# Patient Record
Sex: Female | Born: 1995 | Race: White | Hispanic: No | Marital: Single | Smoking: Current every day smoker
Health system: Southern US, Community
[De-identification: ages and names within clinical notes are randomized; demographics above are authoritative.]

## PROBLEM LIST (undated history)

## (undated) DIAGNOSIS — Z789 Other specified health status: Secondary | ICD-10-CM

## (undated) HISTORY — PX: NO PAST SURGERIES: SHX2092

---

## 2015-03-07 ENCOUNTER — Emergency Department
Admit: 2015-03-07 | Disposition: A | Discharge status: Home / Self Care | Payer: Self-pay | Admitting: Emergency Medicine

## 2015-03-07 LAB — URINALYSIS, COMPLETE
Bacteria: NONE SEEN
Bilirubin,UR: NEGATIVE
Glucose,UR: NEGATIVE mg/dL (ref 0–75)
Leukocyte Esterase: NEGATIVE
NITRITE: NEGATIVE
PH: 6 (ref 4.5–8.0)
PROTEIN: NEGATIVE
SPECIFIC GRAVITY: 1.012 (ref 1.003–1.030)

## 2015-03-07 LAB — BASIC METABOLIC PANEL
ANION GAP: 9 (ref 7–16)
BUN: 8 mg/dL
CHLORIDE: 107 mmol/L
CO2: 23 mmol/L
Calcium, Total: 9.4 mg/dL
Creatinine: 0.61 mg/dL
EGFR (African American): >60
GLUCOSE: 89 mg/dL
Potassium: 3.8 mmol/L
Sodium: 139 mmol/L

## 2015-03-07 LAB — CBC
HCT: 38.8 % (ref 35.0–47.0)
HGB: 13 g/dL (ref 12.0–16.0)
MCH: 28.7 pg (ref 26.0–34.0)
MCHC: 33.5 g/dL (ref 32.0–36.0)
MCV: 86 fL (ref 80–100)
Platelet: 319 10*3/uL (ref 150–440)
RBC: 4.52 10*6/uL (ref 3.80–5.20)
RDW: 13 % (ref 11.5–14.5)
WBC: 12.8 10*3/uL — ABNORMAL HIGH (ref 3.6–11.0)

## 2015-03-07 LAB — TROPONIN I

## 2016-04-21 ENCOUNTER — Encounter: Payer: Self-pay | Admitting: Obstetrics and Gynecology

## 2016-05-11 ENCOUNTER — Encounter: Payer: Self-pay | Admitting: Obstetrics and Gynecology

## 2016-05-16 ENCOUNTER — Encounter: Payer: Self-pay | Admitting: Obstetrics and Gynecology

## 2016-05-16 ENCOUNTER — Ambulatory Visit (INDEPENDENT_AMBULATORY_CARE_PROVIDER_SITE_OTHER): Payer: BLUE CROSS/BLUE SHIELD

## 2016-05-16 ENCOUNTER — Encounter (HOSPITAL_COMMUNITY): Payer: Self-pay

## 2016-05-16 ENCOUNTER — Ambulatory Visit (INDEPENDENT_AMBULATORY_CARE_PROVIDER_SITE_OTHER): Payer: BLUE CROSS/BLUE SHIELD | Admitting: Obstetrics and Gynecology

## 2016-05-16 ENCOUNTER — Other Ambulatory Visit: Payer: Self-pay | Admitting: Obstetrics and Gynecology

## 2016-05-16 ENCOUNTER — Encounter (HOSPITAL_COMMUNITY)
Admission: RE | Admit: 2016-05-16 | Discharge: 2016-05-16 | Disposition: A | Discharge status: Home / Self Care | Payer: BLUE CROSS/BLUE SHIELD | Source: Ambulatory Visit | Attending: Obstetrics and Gynecology | Admitting: Obstetrics and Gynecology

## 2016-05-16 VITALS — BP 120/70 | Ht 62.0 in | Wt 128.0 lb

## 2016-05-16 DIAGNOSIS — IMO0002 Reserved for concepts with insufficient information to code with codable children: Secondary | ICD-10-CM

## 2016-05-16 DIAGNOSIS — O021 Missed abortion: Secondary | ICD-10-CM | POA: Diagnosis not present

## 2016-05-16 DIAGNOSIS — O283 Abnormal ultrasonic finding on antenatal screening of mother: Secondary | ICD-10-CM | POA: Diagnosis not present

## 2016-05-16 DIAGNOSIS — O3680X1 Pregnancy with inconclusive fetal viability, fetus 1: Secondary | ICD-10-CM

## 2016-05-16 DIAGNOSIS — Z3A08 8 weeks gestation of pregnancy: Secondary | ICD-10-CM

## 2016-05-16 DIAGNOSIS — Z3201 Encounter for pregnancy test, result positive: Secondary | ICD-10-CM

## 2016-05-16 DIAGNOSIS — Z87891 Personal history of nicotine dependence: Secondary | ICD-10-CM | POA: Diagnosis not present

## 2016-05-16 DIAGNOSIS — Z880 Allergy status to penicillin: Secondary | ICD-10-CM | POA: Diagnosis not present

## 2016-05-16 HISTORY — DX: Other specified health status: Z78.9

## 2016-05-16 LAB — CBC
HEMATOCRIT: 39.1 % (ref 36.0–46.0)
HEMOGLOBIN: 12.9 g/dL (ref 12.0–15.0)
MCH: 28.7 pg (ref 26.0–34.0)
MCHC: 33 g/dL (ref 30.0–36.0)
MCV: 87.1 fL (ref 78.0–100.0)
Platelets: 298 10*3/uL (ref 150–400)
RBC: 4.49 MIL/uL (ref 3.87–5.11)
RDW: 12.7 % (ref 11.5–15.5)
WBC: 9.7 10*3/uL (ref 4.0–10.5)

## 2016-05-16 LAB — URINALYSIS, ROUTINE W REFLEX MICROSCOPIC
Bilirubin Urine: NEGATIVE
Glucose, UA: NEGATIVE mg/dL
Hgb urine dipstick: NEGATIVE
Ketones, ur: NEGATIVE mg/dL
Nitrite: NEGATIVE
PH: 5.5 (ref 5.0–8.0)
Protein, ur: NEGATIVE mg/dL
Specific Gravity, Urine: 1.02 (ref 1.005–1.030)

## 2016-05-16 LAB — URINE MICROSCOPIC-ADD ON: RBC / HPF: NONE SEEN RBC/hpf (ref 0–5)

## 2016-05-16 LAB — POCT URINE PREGNANCY: PREG TEST UR: POSITIVE — AB

## 2016-05-16 NOTE — Progress Notes (Signed)
Patient ID: Taylor Miller, female   DOB: 02/03/1996, 20 y.o.   MRN: [id number]  Preoperative History and Physical  Taylor PeachBrooke M Hollomon is a 20 y.o. No obstetric history on file. Pt here to discuss a D&C. She had an US at [facility] on 04/04/16 and was told there was no fetal heartbeat. Per US review, baby was 1.8 cm, 8 weeks 2 days. Pt was 14 weeks per menstrual calulations. [place] was sent [place] the [place] [facility]. Denies any spotting or bleeding.  Proposed surgery: D&C, suction  History reviewed. No pertinent past medical history. History reviewed. No pertinent past surgical history. OB History  No data available  Patient denies any other pertinent gynecologic issues.   No current outpatient prescriptions on file prior to visit.   No current facility-administered medications on file prior to visit.   Allergies  Allergen Reactions  . Penicillins     As a child   . Asa [Aspirin] Rash    Social History:   reports that she has quit smoking. She has never used smokeless tobacco. She reports that she does not drink alcohol or use illicit drugs.  History reviewed. No pertinent family history.  Review of Systems: Noncontributory  PHYSICAL EXAM: Blood pressure 120/70, height 5\' 2"  (1.575 m), weight 128 lb (58.06 kg). General appearance - alert, well appearing, and in no distress Extremities - peripheral pulses normal, no pedal edema, no clubbing or cyanosis See pelvic u/s Labs: Results for orders placed or performed in visit on 05/16/16 (from the past 336 hour(s))  POCT urine pregnancy   Collection Time: 05/16/16 11:55 AM  Result Value Ref Range   Preg Test, Ur Positive (A) Negative    Imaging Studies: No results found.  Assessment: There are no active problems to display for this patient. US confirms non viable pregnancy with 8 week fetal pole and 11 week sac; pt not considered a candidate for Cytotec or medical management, will proceed with D&C Discussed  procedure with pt, risks, rationale for D&C instead of cytotec, potential for complications,  Discussed birth control options s/p procedure  Plan: Suction D&C scheduled 05/18/16 Pre-op visit at 1:15pm today  By signing my name below, I, Marisue HumbleMichelle Chaffee, attest that this documentation has been prepared under the direction and in the presence of Tilda BurrowJohn V Carrolyn Hilmes, MD . Electronically Signed: Marisue HumbleMichelle Chaffee, Scribe. 05/16/2016. 12:45 PM.  I personally performed the services described in this documentation, which was SCRIBED in my presence. The recorded information has been reviewed and considered accurate. It has been edited as necessary during review. Tilda BurrowFERGUSON,Makell Drohan V, MD

## 2016-05-16 NOTE — Progress Notes (Signed)
Patient ID: Taylor Miller, female   DOB: 10/31/1996, 20 y.o.   MRN: [id number] Pt here today to discuss a D&C. Pt states that she had an US on 04/04/16 and was told there were no FHT. Pt states that she never bled or took any medication at all. UPT today is faintly positive.

## 2016-05-16 NOTE — Progress Notes (Signed)
US 7+6 wks single IUP w/NO fht,crl 1.5 cm,5.5 cm GS=11+3 wks,normal ov's bilat,results were discussed w/Dr.Ferguson.

## 2016-05-16 NOTE — Patient Instructions (Signed)
Taylor Miller  05/16/2016     @PREFPERIOPPHARMACY @   Your procedure is scheduled on 05/18/2016  [facility] at [facility]20 [facility].  Call this number if you have problems the morning of surgery:  [phone]   Remember:  Do not eat food or drink liquids after midnight.  Take these medicines the morning of surgery with A SIP OF WATER None   Do not wear jewelry, make-up or nail polish.  Do not wear lotions, powders, or perfumes.  You may wear deoderant.  Do not shave 48 hours prior to surgery.  Men may shave face and neck.  Do not bring valuables to the hospital.  [facility] is not responsible for any belongings or valuables.  Contacts, dentures or bridgework may not be worn into surgery.  Leave your suitcase in the car.  After surgery it may be brought to your room.  For patients admitted to the hospital, discharge time will be determined by your treatment team.  Patients discharged the day of surgery will not be allowed to drive home.    Please read over the following fact sheets that you were given. Surgical Site Infection Prevention and Anesthesia Post-op Instructions     PATIENT INSTRUCTIONS POST-ANESTHESIA  IMMEDIATELY FOLLOWING SURGERY:  Do not drive or operate machinery for the first twenty four hours after surgery.  Do not make any important decisions for twenty four hours after surgery or while taking narcotic pain medications or sedatives.  If you develop intractable nausea and vomiting or a severe headache please notify your doctor immediately.  FOLLOW-UP:  Please make an appointment with your surgeon as instructed. You do not need to follow up with anesthesia unless specifically instructed to do so.  WOUND CARE INSTRUCTIONS (if applicable):  Keep a dry clean dressing on the anesthesia/puncture wound site if there is drainage.  Once the wound has quit draining you may leave it open to air.  Generally you should leave the bandage intact for twenty four hours  unless there is drainage.  If the epidural site drains for more than 36-48 hours please call the anesthesia department.  QUESTIONS?:  Please feel free to call your physician or the hospital operator if you have any questions, and they will be happy to assist you.      Dilation and Curettage or Vacuum Curettage Dilation and curettage (D&C) and vacuum curettage are minor procedures. A D&C involves stretching (dilation) the cervix and scraping (curettage) the inside lining of the womb (uterus). During a D&C, tissue is gently scraped from the inside lining of the uterus. During a vacuum curettage, the lining and tissue in the uterus are removed with the use of gentle suction.  Curettage may be performed to either diagnose or treat a problem. As a diagnostic procedure, curettage is performed to examine tissues from the uterus. A diagnostic curettage may be performed for the following symptoms:   Irregular bleeding in the uterus.   Bleeding with the development of clots.   Spotting between menstrual periods.   Prolonged menstrual periods.   Bleeding after menopause.   No menstrual period (amenorrhea).   A change in size and shape of the uterus.  As a treatment procedure, curettage may be performed for the following reasons:   Removal of an IUD (intrauterine device).   Removal of retained placenta after giving birth. Retained placenta can cause an infection or bleeding severe enough to require transfusions.   Abortion.   Miscarriage.   Removal of  polyps inside the uterus.   Removal of uncommon types of noncancerous lumps (fibroids).  LET [facility] KNOW ABOUT:   Any allergies you have.   All medicines you are taking, including vitamins, herbs, eye drops, creams, and over-the-counter medicines.   Previous problems you or members of your family have had with the use of anesthetics.   Any blood disorders you have.   Previous surgeries you have had.    Medical conditions you have. RISKS AND COMPLICATIONS  Generally, this is a safe procedure. However, as with any procedure, complications can occur. Possible complications include:  Excessive bleeding.   Infection of the uterus.   Damage to the cervix.   Development of scar tissue (adhesions) inside the uterus, later causing abnormal amounts of menstrual bleeding.   Complications from the general anesthetic, if a general anesthetic is used.   Putting a hole (perforation) in the uterus. This is rare.  BEFORE THE PROCEDURE   Eat and drink before the procedure only as directed by your health care provider.   Arrange for someone to take you home.  PROCEDURE  This procedure usually takes about 15-30 minutes.  You will be given one of the following:  A medicine that numbs the area in and around the cervix (local anesthetic).   A medicine to make you sleep through the procedure (general anesthetic).  You will lie on your back with your legs in stirrups.   A warm metal or plastic instrument (speculum) will be placed in your vagina to keep it open and to allow the health care provider to see the cervix.  There are two ways in which your cervix can be softened and dilated. These include:   Taking a medicine.   Having thin rods (laminaria) inserted into your cervix.   A curved tool (curette) will be used to scrape cells from the inside lining of the uterus. In some cases, gentle suction is applied with the curette. The curette will then be removed.  AFTER THE PROCEDURE   You will rest in the recovery area until you are stable and are ready to go home.   You may feel sick to your stomach (nauseous) or throw up (vomit) if you were given a general anesthetic.   You may have a sore throat if a tube was placed in your throat during general anesthesia.   You may have light cramping and bleeding. This may last for 2 days to 2 weeks after the procedure.   Your  uterus needs to make a new lining after the procedure. This may make your next period late.   This information is not intended to replace advice given to you by your health care provider. Make sure you discuss any questions you have with your health care provider.   Document Released: 10/31/2005 Document Revised: 07/03/2013 Document Reviewed: 05/30/2013 Elsevier Interactive Patient Education [employer]2016 Elsevier Inc.

## 2016-05-18 ENCOUNTER — Ambulatory Visit (HOSPITAL_COMMUNITY)
Admission: RE | Admit: 2016-05-18 | Discharge: 2016-05-18 | Disposition: A | Discharge status: Home / Self Care | Payer: BLUE CROSS/BLUE SHIELD | Source: Ambulatory Visit | Attending: Obstetrics and Gynecology | Admitting: Obstetrics and Gynecology

## 2016-05-18 ENCOUNTER — Encounter (HOSPITAL_COMMUNITY)
Admission: RE | Disposition: A | Discharge status: Home / Self Care | Payer: Self-pay | Source: Ambulatory Visit | Attending: Obstetrics and Gynecology

## 2016-05-18 ENCOUNTER — Ambulatory Visit (HOSPITAL_COMMUNITY): Payer: BLUE CROSS/BLUE SHIELD | Admitting: Anesthesiology

## 2016-05-18 ENCOUNTER — Encounter (HOSPITAL_COMMUNITY): Payer: Self-pay | Admitting: *Deleted

## 2016-05-18 DIAGNOSIS — Z880 Allergy status to penicillin: Secondary | ICD-10-CM | POA: Insufficient documentation

## 2016-05-18 DIAGNOSIS — Z87891 Personal history of nicotine dependence: Secondary | ICD-10-CM | POA: Insufficient documentation

## 2016-05-18 DIAGNOSIS — O021 Missed abortion: Secondary | ICD-10-CM | POA: Insufficient documentation

## 2016-05-18 HISTORY — PX: DILATION AND CURETTAGE OF UTERUS: SHX78

## 2016-05-18 LAB — BASIC METABOLIC PANEL
Anion gap: 6 (ref 5–15)
BUN: 11 mg/dL (ref 6–20)
CALCIUM: 8.9 mg/dL (ref 8.9–10.3)
CHLORIDE: 103 mmol/L (ref 101–111)
CO2: 25 mmol/L (ref 22–32)
CREATININE: 0.73 mg/dL (ref 0.44–1.00)
Glucose, Bld: 92 mg/dL (ref 65–99)
Potassium: 3.4 mmol/L — ABNORMAL LOW (ref 3.5–5.1)
SODIUM: 134 mmol/L — AB (ref 135–145)

## 2016-05-18 LAB — ABO/RH: ABO/RH(D): B POS

## 2016-05-18 SURGERY — DILATION AND CURETTAGE
Anesthesia: General | Site: Uterus

## 2016-05-18 MED ORDER — KETOROLAC TROMETHAMINE 30 MG/ML IJ SOLN: 30.0000 mg | Freq: Once | INTRAMUSCULAR | Status: DC

## 2016-05-18 MED ORDER — MIDAZOLAM HCL 5 MG/5ML IJ SOLN
INTRAMUSCULAR | Status: DC | PRN
  Administered 2016-05-18: 2 mg via INTRAVENOUS

## 2016-05-18 MED ORDER — FENTANYL CITRATE (PF) 100 MCG/2ML IJ SOLN
25.0000 ug | INTRAMUSCULAR | Status: DC | PRN
  Administered 2016-05-18: 50 ug via INTRAVENOUS
  Filled 2016-05-18: qty 2

## 2016-05-18 MED ORDER — METRONIDAZOLE IN NACL 5-0.79 MG/ML-% IV SOLN
500.0000 mg | INTRAVENOUS | Status: AC
  Administered 2016-05-18: 500 mg via INTRAVENOUS

## 2016-05-18 MED ORDER — PROPOFOL 10 MG/ML IV BOLUS
INTRAVENOUS | Status: DC | PRN
  Administered 2016-05-18: 150 mg via INTRAVENOUS

## 2016-05-18 MED ORDER — PROPOFOL 10 MG/ML IV BOLUS
INTRAVENOUS | Status: AC
Start: 2016-05-18 — End: 2016-05-18
  Filled 2016-05-18: qty 20

## 2016-05-18 MED ORDER — METRONIDAZOLE IN NACL 5-0.79 MG/ML-% IV SOLN
INTRAVENOUS | Status: AC
  Filled 2016-05-18: qty 100

## 2016-05-18 MED ORDER — ARTIFICIAL TEARS OP OINT
TOPICAL_OINTMENT | OPHTHALMIC | Status: AC
  Filled 2016-05-18: qty 3.5

## 2016-05-18 MED ORDER — ONDANSETRON HCL 4 MG/2ML IJ SOLN
4.0000 mg | Freq: Once | INTRAMUSCULAR | Status: AC
  Administered 2016-05-18: 4 mg via INTRAVENOUS

## 2016-05-18 MED ORDER — MIDAZOLAM HCL 2 MG/2ML IJ SOLN
INTRAMUSCULAR | Status: AC
  Filled 2016-05-18: qty 2

## 2016-05-18 MED ORDER — FENTANYL CITRATE (PF) 100 MCG/2ML IJ SOLN
INTRAMUSCULAR | Status: AC
  Filled 2016-05-18: qty 2

## 2016-05-18 MED ORDER — SUCCINYLCHOLINE CHLORIDE 20 MG/ML IJ SOLN
INTRAMUSCULAR | Status: AC
  Filled 2016-05-18: qty 1

## 2016-05-18 MED ORDER — LIDOCAINE HCL (PF) 1 % IJ SOLN
INTRAMUSCULAR | Status: AC
  Filled 2016-05-18: qty 5

## 2016-05-18 MED ORDER — FENTANYL CITRATE (PF) 100 MCG/2ML IJ SOLN
INTRAMUSCULAR | Status: DC | PRN
  Administered 2016-05-18: 25 ug via INTRAVENOUS
  Administered 2016-05-18: 50 ug via INTRAVENOUS
  Administered 2016-05-18: 25 ug via INTRAVENOUS

## 2016-05-18 MED ORDER — LACTATED RINGERS IV SOLN
INTRAVENOUS | Status: DC
  Administered 2016-05-18 (×3): via INTRAVENOUS

## 2016-05-18 MED ORDER — ONDANSETRON HCL 4 MG/2ML IJ SOLN
INTRAMUSCULAR | Status: AC
  Filled 2016-05-18: qty 2

## 2016-05-18 MED ORDER — LIDOCAINE HCL (CARDIAC) 20 MG/ML IV SOLN
INTRAVENOUS | Status: DC | PRN
  Administered 2016-05-18: 50 mg via INTRAVENOUS

## 2016-05-18 MED ORDER — OXYTOCIN 10 UNIT/ML IJ SOLN
INTRAMUSCULAR | Status: AC
  Filled 2016-05-18: qty 1

## 2016-05-18 MED ORDER — CIPROFLOXACIN IN D5W 400 MG/200ML IV SOLN
400.0000 mg | INTRAVENOUS | Status: AC
  Administered 2016-05-18: 400 mg via INTRAVENOUS

## 2016-05-18 MED ORDER — ONDANSETRON HCL 4 MG/2ML IJ SOLN: 4.0000 mg | Freq: Once | INTRAMUSCULAR | Status: DC | PRN

## 2016-05-18 MED ORDER — FENTANYL CITRATE (PF) 100 MCG/2ML IJ SOLN
25.0000 ug | INTRAMUSCULAR | Status: AC | PRN
  Administered 2016-05-18 (×2): 25 ug via INTRAVENOUS

## 2016-05-18 MED ORDER — MIDAZOLAM HCL 2 MG/2ML IJ SOLN
1.0000 mg | INTRAMUSCULAR | Status: DC | PRN
  Administered 2016-05-18: 2 mg via INTRAVENOUS

## 2016-05-18 MED ORDER — CIPROFLOXACIN IN D5W 400 MG/200ML IV SOLN
INTRAVENOUS | Status: AC
  Filled 2016-05-18: qty 200

## 2016-05-18 MED ORDER — OXYTOCIN 10 UNIT/ML IJ SOLN
INTRAMUSCULAR | Status: DC | PRN
  Administered 2016-05-18: 10 [IU] via INTRAMUSCULAR

## 2016-05-18 MED ORDER — 0.9 % SODIUM CHLORIDE (POUR BTL) OPTIME
TOPICAL | Status: DC | PRN
  Administered 2016-05-18: 1000 mL

## 2016-05-18 SURGICAL SUPPLY — 23 items
BAG HAMPER (MISCELLANEOUS) ×3 IMPLANT
CLOTH BEACON ORANGE TIMEOUT ST (SAFETY) ×3 IMPLANT
COVER LIGHT HANDLE STERIS (MISCELLANEOUS) ×6 IMPLANT
COVER MAYO STAND XLG (DRAPE) ×3 IMPLANT
CURETTE VACUUM 9MM CVD CLR (CANNULA) ×2 IMPLANT
FORMALIN 10 PREFIL 480ML (MISCELLANEOUS) ×3 IMPLANT
GAUZE SPONGE 4X4 16PLY XRAY LF (GAUZE/BANDAGES/DRESSINGS) ×3 IMPLANT
GLOVE BIOGEL PI IND STRL 7.0 (GLOVE) ×1 IMPLANT
GLOVE BIOGEL PI IND STRL 9 (GLOVE) ×1 IMPLANT
GLOVE BIOGEL PI INDICATOR 7.0 (GLOVE) ×2
GLOVE BIOGEL PI INDICATOR 9 (GLOVE) ×2
GLOVE ECLIPSE 9.0 STRL (GLOVE) ×3 IMPLANT
GOWN STRL REUS W/TWL LRG LVL3 (GOWN DISPOSABLE) ×3 IMPLANT
KIT BERKELEY 1ST TRIMESTER 3/8 (MISCELLANEOUS) ×3 IMPLANT
KIT ROOM TURNOVER AP CYSTO (KITS) ×3 IMPLANT
MARKER SKIN DUAL TIP RULER LAB (MISCELLANEOUS) ×3 IMPLANT
NS IRRIG 1000ML POUR BTL (IV SOLUTION) ×3 IMPLANT
PACK BASIC III (CUSTOM PROCEDURE TRAY) ×3
PACK SRG BSC III STRL LF ECLPS (CUSTOM PROCEDURE TRAY) ×1 IMPLANT
PAD ARMBOARD 7.5X6 YLW CONV (MISCELLANEOUS) ×3 IMPLANT
SET BERKELEY SUCTION TUBING (SUCTIONS) ×3 IMPLANT
TOWEL OR 17X26 4PK STRL BLUE (TOWEL DISPOSABLE) ×3 IMPLANT
VACURETTE 10MM (CANNULA) ×2 IMPLANT

## 2016-05-18 NOTE — OR Nursing (Signed)
Per Sheri in lab , pt. Is  B pos.  ,no rhogam needed

## 2016-05-18 NOTE — Anesthesia Postprocedure Evaluation (Signed)
Anesthesia Post Note  Patient: Taylor Miller  Procedure(s) Performed: Procedure(s) (LRB): SUCTION DILATATION AND CURETTAGE (N/A)  Patient location during evaluation: PACU Anesthesia Type: General Level of consciousness: awake and oriented Pain management: pain level controlled Vital Signs Assessment: post-procedure vital signs reviewed and stable Respiratory status: spontaneous breathing Cardiovascular status: stable Postop Assessment: no signs of nausea or vomiting Anesthetic complications: no    Last Vitals:  Filed Vitals:   05/18/16 1038  Temp: 37 C    Last Pain: There were no vitals filed for this visit.               ADAMS, AMY A

## 2016-05-18 NOTE — Transfer of Care (Signed)
Immediate Anesthesia Transfer of Care Note  Patient: Taylor Miller  Procedure(s) Performed: Procedure(s): SUCTION DILATATION AND CURETTAGE (N/A)  Patient Location: PACU  Anesthesia Type:General  Level of Consciousness: awake, oriented and patient cooperative  Airway & Oxygen Therapy: Patient Spontanous Breathing  Post-op Assessment: Report given to RN and Post -op Vital signs reviewed and stable  Post vital signs: Reviewed and stable  Last Vitals:  Filed Vitals:   05/18/16 1038  Temp: 37 C    Last Pain: There were no vitals filed for this visit.    Patients Stated Pain Goal: 8 (05/18/16 1058)  Complications: No apparent anesthesia complications

## 2016-05-18 NOTE — Anesthesia Preprocedure Evaluation (Addendum)
Anesthesia Evaluation  Patient identified by MRN, date of birth, ID band Patient awake    Reviewed: Allergy & Precautions, NPO status , Patient's Chart, lab work & pertinent test results  Airway Mallampati: I  TM Distance: >3 FB     Dental  (+) Teeth Intact   Pulmonary former smoker,    breath sounds clear to auscultation       Cardiovascular negative cardio ROS   Rhythm:Regular Rate:Normal     Neuro/Psych    GI/Hepatic negative GI ROS,   Endo/Other    Renal/GU      Musculoskeletal   Abdominal   Peds  Hematology   Anesthesia Other Findings   Reproductive/Obstetrics                            Anesthesia Physical Anesthesia Plan  ASA: I  Anesthesia Plan: General   Post-op Pain Management:    Induction: Intravenous  Airway Management Planned: LMA  Additional Equipment:   Intra-op Plan:   Post-operative Plan: Extubation in OR  Informed Consent: I have reviewed the patients History and Physical, chart, labs and discussed the procedure including the risks, benefits and alternatives for the proposed anesthesia with the patient or authorized representative who has indicated his/her understanding and acceptance.     Plan Discussed with:   Anesthesia Plan Comments:         Anesthesia Quick Evaluation

## 2016-05-18 NOTE — Op Note (Signed)
Please see brief operative for surgical details

## 2016-05-18 NOTE — Discharge Instructions (Signed)
Incomplete Miscarriage A miscarriage is the sudden loss of an unborn baby (fetus) before the 20th week of pregnancy. In an incomplete miscarriage, parts of the fetus or placenta (afterbirth) remain in the body.  Having a miscarriage can be an emotional experience. Talk with your health care provider about any questions you may have about miscarrying, the grieving process, and your future pregnancy plans. CAUSES   Problems with the fetal chromosomes that make it impossible for the baby to develop normally. Problems with the baby's genes or chromosomes are most often the result of errors that occur by chance as the embryo divides and grows. The problems are not inherited from the parents.  Infection of the cervix or uterus.  Hormone problems.  Problems with the cervix, such as having an incompetent cervix. This is when the tissue in the cervix is not strong enough to hold the pregnancy.  Problems with the uterus, such as an abnormally shaped uterus, uterine fibroids, or congenital abnormalities.  Certain medical conditions.  Smoking, drinking alcohol, or taking illegal drugs.  Trauma. SYMPTOMS   Vaginal bleeding or spotting, with or without cramps or pain.  Pain or cramping in the abdomen or lower back.  Passing fluid, tissue, or blood clots from the vagina. DIAGNOSIS  Your health care provider will perform a physical exam. You may also have an ultrasound to confirm the miscarriage. Blood or urine tests may also be ordered. TREATMENT   Usually, a dilation and curettage (D&C) procedure is performed. During a D&C procedure, the cervix is widened (dilated) and any remaining fetal or placental tissue is gently removed from the uterus.  Antibiotic medicines are prescribed if there is an infection. Other medicines may be given to reduce the size of the uterus (contract) if there is a lot of bleeding.  If you have Rh negative blood and your baby was Rh positive, you will need a Rho (D)  immune globulin shot. This shot will protect any future baby from having Rh blood problems in future pregnancies.  You may be confined to bed rest. This means you should stay in bed and only get up to use the bathroom. HOME CARE INSTRUCTIONS   Rest as directed by your health care provider.  Restrict activity as directed by your health care provider. You may be allowed to continue light activity if curettage was not done but you require further treatment.  Keep track of the number of pads you use each day. Keep track of how soaked (saturated) they are. Record this information.  Do not  use tampons.  Do not douche or have sexual intercourse until approved by your health care provider.  Keep all follow-up appointments for reevaluation and continuing management.  Only take over-the-counter or prescription medicines for pain, fever, or discomfort as directed by your health care provider.  Take antibiotic medicine as directed by your health care provider. Make sure you finish it even if you start to feel better. SEEK IMMEDIATE MEDICAL CARE IF:   You experience severe cramps in your stomach, back, or abdomen.  You have an unexplained temperature (make sure to record these temperatures).  You pass large clots or tissue (save these for your health care provider to inspect).  Your bleeding increases.  You become light-headed, weak, or have fainting episodes. MAKE SURE YOU:   Understand these instructions.  Will watch your condition.  Will get help right away if you are not doing well or get worse.   This information is not intended to   replace advice given to you by your health care provider. Make sure you discuss any questions you have with your health care provider.   Document Released: 10/31/2005 Document Revised: 11/21/2014 Document Reviewed: 05/30/2013 Elsevier Interactive Patient Education 2016 Elsevier Inc.  

## 2016-05-18 NOTE — Anesthesia Procedure Notes (Signed)
Procedure Name: LMA Insertion Date/Time: 05/18/2016 12:45 PM Performed by: Pernell DupreADAMS, Madoline Bhatt A Pre-anesthesia Checklist: Patient identified, Timeout performed, Emergency Drugs available, Suction available and Patient being monitored Patient Re-evaluated:Patient Re-evaluated prior to inductionOxygen Delivery Method: Circle system utilized Preoxygenation: Pre-oxygenation with 100% oxygen Intubation Type: IV induction Ventilation: Mask ventilation without difficulty LMA: LMA inserted LMA Size: 4.0 Number of attempts: 1 Placement Confirmation: positive ETCO2 and breath sounds checked- equal and bilateral Tube secured with: Tape Dental Injury: Teeth and Oropharynx as per pre-operative assessment

## 2016-05-18 NOTE — H&P (Signed)
    Progress Notes    Expand All Collapse All   Patient ID: Taylor Miller, female DOB: 12-19-1995, 20 y.o. MRN: [id number]  Preoperative History and Physical  Taylor PeachBrooke M Laver is a 20 y.o. No obstetric history on file. Pt here to discuss a D&C. [facility] and was told there was no fetal heartbeat. Per US review, baby was 1.8 cm, 8 weeks 2 days. Pt was 14 weeks per menstrual calulations. [place] [facility] [facility]. Denies any spotting or bleeding.  Proposed surgery: D&C, suction  History reviewed. No pertinent past medical history. History reviewed. No pertinent past surgical history. OB History  No data available  Patient denies any other pertinent gynecologic issues.   No current outpatient prescriptions on file prior to visit.   No current facility-administered medications on file prior to visit.   Allergies  Allergen Reactions  . Penicillins     As a child   . Asa [Aspirin] Rash    Social History:  reports that she has quit smoking. She has never used smokeless tobacco. She reports that she does not drink alcohol or use illicit drugs.  History reviewed. No pertinent family history.  Review of Systems: Noncontributory  PHYSICAL EXAM: Blood pressure 120/70, height 5\' 2"  (1.575 m), weight 128 lb (58.06 kg). General appearance - alert, well appearing, and in no distress Extremities - peripheral pulses normal, no pedal edema, no clubbing or cyanosis See pelvic u/s Labs: Results for orders placed or performed in visit on 05/16/16 (from the past 336 hour(s))  POCT urine pregnancy   Collection Time: 05/16/16 11:55 AM  Result Value Ref Range   Preg Test, Ur Positive (A) Negative    Imaging Studies:  Imaging Results    No results found.    Assessment: There are no active problems to display for this patient. US confirms non viable pregnancy with 8 week fetal pole and 11  week sac; pt not considered a candidate for Cytotec or medical management, will proceed with D&C Discussed procedure with pt, risks, rationale for D&C instead of cytotec, potential for complications,  Discussed birth control options s/p procedure  Plan: Suction D&C scheduled 05/18/16   By signing my name below, I, Marisue HumbleMichelle Chaffee, attest that this documentation has been prepared under the direction and in the presence of Tilda BurrowJohn V Pennye Beeghly, MD . Electronically Signed: Marisue HumbleMichelle Chaffee, Scribe. 05/16/2016. 12:45 PM.  I personally performed the services described in this documentation, which was SCRIBED in my presence. The recorded information has been reviewed and considered accurate. It has been edited as necessary during review. Tilda BurrowFERGUSON,Jenessa Gillingham V, MD      Pt affirms today that there has been no change in plans or condition since this office eval.

## 2016-05-18 NOTE — Brief Op Note (Signed)
05/18/2016  1:29 PM  PATIENT:  Taylor Miller  20 y.o. female  PRE-OPERATIVE DIAGNOSIS:  MISSED ABORTION  POST-OPERATIVE DIAGNOSIS:  missed abortion  PROCEDURE:  Procedure(s): SUCTION DILATATION AND CURETTAGE (N/A)  SURGEON:  Surgeon(s) and Role:    * Tilda BurrowJohn V Dorann Davidson, MD - Primary  PHYSICIAN ASSISTANT:   ASSISTANTS: Trenton FoundsWendy Kendrick CST   ANESTHESIA:   general and MAC   LMA EBL:  Total I/O In: 1100 [I.V.:1100] Out: 50 [Blood:50]  BLOOD ADMINISTERED:none  DRAINS: none   LOCAL MEDICATIONS USED:  NONE  SPECIMEN:  Source of Specimen:  Products of conception from uterine cavity  DISPOSITION OF SPECIMEN:  PATHOLOGY  COUNTS:  YES  TOURNIQUET:  * No tourniquets in log *  DICTATION: .Dragon Dictation  PLAN OF CARE: Discharge to home after PACU  PATIENT DISPOSITION:  PACU - hemodynamically stable.   Delay start of Pharmacological VTE agent (>24hrs) due to surgical blood loss or risk of bleeding: not applicable Indications 20 year old female gravida 1 para 0 blood type B positive with ultrasound confirmed fetal loss in May, returning to our office July third with evidence of continued missed AB status, for suction D&C Details of procedure. Patient was taken operating room prepped and draped for vaginal procedure and timeout conducted. Antibiotics administered. Cervix was grasped with single-tooth tenaculum and sounded in the midline slightly retroverted position to 10 cm dilated to a G9 JamaicaFrench allowing introduction of the 9 mm suction curet into the uterine cavity. Suction curettage was performed in a circumferential fashion, but we were not obtaining anything because the bag of waters remained intact and was not ruptured by the placement of suction curet into the uterine cavity. Was then again dilated slightly further to 1 [place] which resulted in membrane rupture and suctioning of the generous amount of amniotic fluid was followed by spontaneous expulsion versus small fetal pole  and then forceps could then be used to grasp the edges of the membrane remnants which were visible in the cervical os and the sac was extracted intact curettage was then performed confirming satisfactory evacuation of the uterine cavity with smooth sharp curettage obtaining the uniform gritty feel in all 4 quadrants , then bimanual exam confirmed a dramatically smaller uterus. There was no suspicion of complications and bleeding was minimal to 25 cc sponge and needle counts were correct. Procedure was tolerated well by the patient who will go to recovery room, receive IV Toradol 1 and be discharged home today

## 2016-05-19 LAB — RPR: RPR: NONREACTIVE

## 2016-05-26 ENCOUNTER — Encounter (HOSPITAL_COMMUNITY): Payer: Self-pay | Admitting: Obstetrics and Gynecology

## 2017-01-24 ENCOUNTER — Encounter: Payer: Self-pay | Admitting: Emergency Medicine

## 2017-01-24 DIAGNOSIS — K529 Noninfective gastroenteritis and colitis, unspecified: Secondary | ICD-10-CM | POA: Insufficient documentation

## 2017-01-24 DIAGNOSIS — Z87891 Personal history of nicotine dependence: Secondary | ICD-10-CM | POA: Insufficient documentation

## 2017-01-24 DIAGNOSIS — R1084 Generalized abdominal pain: Secondary | ICD-10-CM | POA: Diagnosis present

## 2017-01-24 LAB — CBC
HEMATOCRIT: 42.4 % (ref 35.0–47.0)
Hemoglobin: 14.5 g/dL (ref 12.0–16.0)
MCH: 28.6 pg (ref 26.0–34.0)
MCHC: 34.3 g/dL (ref 32.0–36.0)
MCV: 83.4 fL (ref 80.0–100.0)
Platelets: 356 10*3/uL (ref 150–440)
RBC: 5.08 MIL/uL (ref 3.80–5.20)
RDW: 13.2 % (ref 11.5–14.5)
WBC: 18.6 10*3/uL — AB (ref 3.6–11.0)

## 2017-01-24 NOTE — ED Triage Notes (Signed)
Pt in with co n.v.d since this am with generalized abd pain.

## 2017-01-25 ENCOUNTER — Emergency Department: Payer: BLUE CROSS/BLUE SHIELD

## 2017-01-25 ENCOUNTER — Emergency Department
Admission: EM | Admit: 2017-01-25 | Discharge: 2017-01-25 | Disposition: A | Discharge status: Home / Self Care | Payer: BLUE CROSS/BLUE SHIELD | Attending: Emergency Medicine | Admitting: Emergency Medicine

## 2017-01-25 DIAGNOSIS — K529 Noninfective gastroenteritis and colitis, unspecified: Secondary | ICD-10-CM

## 2017-01-25 LAB — URINALYSIS, COMPLETE (UACMP) WITH MICROSCOPIC
BACTERIA UA: NONE SEEN
BILIRUBIN URINE: NEGATIVE
Glucose, UA: NEGATIVE mg/dL
Ketones, ur: 20 mg/dL — AB
Leukocytes, UA: NEGATIVE
NITRITE: NEGATIVE
PROTEIN: 30 mg/dL — AB
SPECIFIC GRAVITY, URINE: 1.033 — AB (ref 1.005–1.030)
pH: 5 (ref 5.0–8.0)

## 2017-01-25 LAB — COMPREHENSIVE METABOLIC PANEL
ALBUMIN: 4.5 g/dL (ref 3.5–5.0)
ALK PHOS: 59 U/L (ref 38–126)
ALT: 15 U/L (ref 14–54)
AST: 22 U/L (ref 15–41)
Anion gap: 8 (ref 5–15)
BUN: 17 mg/dL (ref 6–20)
CALCIUM: 8.8 mg/dL — AB (ref 8.9–10.3)
CHLORIDE: 102 mmol/L (ref 101–111)
CO2: 26 mmol/L (ref 22–32)
CREATININE: 0.79 mg/dL (ref 0.44–1.00)
GFR calc Af Amer: >60 mL/min (ref >60)
GFR calc non Af Amer: >60 mL/min (ref >60)
Glucose, Bld: 106 mg/dL — ABNORMAL HIGH (ref 65–99)
Potassium: 3.3 mmol/L — ABNORMAL LOW (ref 3.5–5.1)
SODIUM: 136 mmol/L (ref 135–145)
Total Bilirubin: 3.1 mg/dL — ABNORMAL HIGH (ref 0.3–1.2)
Total Protein: 7.7 g/dL (ref 6.5–8.1)

## 2017-01-25 LAB — LIPASE, BLOOD: Lipase: <10 U/L — ABNORMAL LOW (ref 11–51)

## 2017-01-25 MED ORDER — MORPHINE SULFATE (PF) 2 MG/ML IV SOLN
2.0000 mg | Freq: Once | INTRAVENOUS | Status: AC
  Administered 2017-01-25: 2 mg via INTRAVENOUS
  Filled 2017-01-25: qty 1

## 2017-01-25 MED ORDER — SODIUM CHLORIDE 0.9 % IV BOLUS (SEPSIS)
1000.0000 mL | Freq: Once | INTRAVENOUS | Status: AC
  Administered 2017-01-25: 1000 mL via INTRAVENOUS

## 2017-01-25 MED ORDER — IOPAMIDOL (ISOVUE-300) INJECTION 61%
100.0000 mL | Freq: Once | INTRAVENOUS | Status: AC | PRN
  Administered 2017-01-25: 100 mL via INTRAVENOUS

## 2017-01-25 MED ORDER — ONDANSETRON 4 MG PO TBDP: 4.0000 mg | ORAL_TABLET | Freq: Three times a day (TID) | ORAL | 0 refills | Status: DC | PRN

## 2017-01-25 MED ORDER — IOPAMIDOL (ISOVUE-300) INJECTION 61%
30.0000 mL | Freq: Once | INTRAVENOUS | Status: AC | PRN
  Administered 2017-01-25: 30 mL via ORAL

## 2017-01-25 MED ORDER — ONDANSETRON HCL 4 MG/2ML IJ SOLN
4.0000 mg | Freq: Once | INTRAMUSCULAR | Status: AC
  Administered 2017-01-25: 4 mg via INTRAVENOUS
  Filled 2017-01-25: qty 2

## 2017-01-25 MED ORDER — ONDANSETRON HCL 4 MG/2ML IJ SOLN
INTRAMUSCULAR | Status: AC
  Administered 2017-01-25: 4 mg via INTRAVENOUS
  Filled 2017-01-25: qty 2

## 2017-01-25 MED ORDER — ONDANSETRON HCL 4 MG/2ML IJ SOLN
4.0000 mg | Freq: Once | INTRAMUSCULAR | Status: AC
  Administered 2017-01-25: 4 mg via INTRAVENOUS

## 2017-01-25 NOTE — ED Notes (Signed)
Pt reports she will not be able to produce any urine at this time and would call out when she could.

## 2017-01-25 NOTE — ED Provider Notes (Signed)
[facility] Emergency Department Provider Note   First MD Initiated Contact with Patient 01/25/17 [phone]     (approximate)  I have reviewed the triage vital signs and the nursing notes.   HISTORY  Chief Complaint Abdominal Pain    HPI Taylor Miller is a 21 y.o. female presents with generalized abdominal pain and vomiting and diarrhea times one day. Patient states the symptoms started this morning and has progressively worsened over the course of the day. Patient denies any by mouth intake today secondary to discomfort vomiting. Patient states current pain score is 9 out of 10. Patient denies any fever afebrile on presentation.   Past Medical History:  Diagnosis Date  . Medical history non-contributory     Patient Active Problem List   Diagnosis Date Noted  . Missed abortion 05/16/2016    Past Surgical History:  Procedure Laterality Date  . DILATION AND CURETTAGE OF UTERUS N/A 05/18/2016   Procedure: SUCTION DILATATION AND CURETTAGE;  Surgeon: Tilda Burrow, MD;  Location: [facility];  Service: Gynecology;  Laterality: N/A;  . NO PAST SURGERIES      Prior to Admission medications   Medication Sig Start Date End Date Taking? Authorizing Provider  ondansetron (ZOFRAN ODT) 4 MG disintegrating tablet Take 1 tablet (4 mg total) by mouth every 8 (eight) hours as needed for nausea or vomiting. 01/25/17   Darci Current, MD    Allergies Penicillins and Asa [aspirin]  No family history on file.  Social History Social History  Substance Use Topics  . Smoking status: Former Smoker    Quit date: 05/17/2015  . Smokeless tobacco: Never Used  . Alcohol use No    Review of Systems Constitutional: No fever/chills Eyes: No visual changes. ENT: No sore throat. Cardiovascular: Denies chest pain. Respiratory: Denies shortness of breath. Gastrointestinal:Positive for abdominal pain vomiting diarrhea Genitourinary: Negative for dysuria. Musculoskeletal:  Negative for back pain. Skin: Negative for rash. Neurological: Negative for headaches, focal weakness or numbness.  10-point ROS otherwise negative.  ____________________________________________   PHYSICAL EXAM:  VITAL SIGNS: ED Triage Vitals  Enc Vitals Group     BP 01/24/17 2332 130/79     Pulse Rate 01/24/17 2332 98     Resp 01/24/17 2332 18     Temp 01/24/17 2332 99.6 F (37.6 C)     Temp Source 01/24/17 2332 Oral     SpO2 01/24/17 2332 98 %     Weight 01/24/17 2333 138 lb (62.6 kg)     Height 01/24/17 2333 5\' 2"  (1.575 m)     Head Circumference --      Peak Flow --      Pain Score 01/24/17 2333 7     Pain Loc --      Pain Edu? --      Excl. in GC? --     Constitutional: Alert and oriented. Well appearing and in no acute distress. Eyes: Conjunctivae are normal. PERRL. EOMI. Head: Atraumatic. Mouth/Throat: Mucous membranes are moist.  Oropharynx non-erythematous. Neck: No stridor.  Cardiovascular: Normal rate, regular rhythm. Good peripheral circulation. Grossly normal heart sounds. Respiratory: Normal respiratory effort.  No retractions. Lungs CTAB. Gastrointestinal: Generalized tenderness to palpation No distention.  Musculoskeletal: No lower extremity tenderness nor edema. No gross deformities of extremities. Neurologic:  Normal speech and language. No gross focal neurologic deficits are appreciated.  Skin:  Skin is warm, dry and intact. No rash noted. Psychiatric: Mood and affect are normal. Speech and behavior  are normal.  ____________________________________________   LABS (all labs ordered are listed, but only abnormal results are displayed)  Labs Reviewed  CBC - Abnormal; Notable for the following:       Result Value   WBC 18.6 (*)    All other components within normal limits  COMPREHENSIVE METABOLIC PANEL - Abnormal; Notable for the following:    Potassium 3.3 (*)    Glucose, Bld 106 (*)    Calcium 8.8 (*)    Total Bilirubin 3.1 (*)    All other  components within normal limits  LIPASE, BLOOD - Abnormal; Notable for the following:    Lipase <10 (*)    All other components within normal limits  URINALYSIS, COMPLETE (UACMP) WITH MICROSCOPIC - Abnormal; Notable for the following:    Color, Urine YELLOW (*)    APPearance CLEAR (*)    Specific Gravity, Urine 1.033 (*)    Hgb urine dipstick SMALL (*)    Ketones, ur 20 (*)    Protein, ur 30 (*)    Squamous Epithelial / LPF 0-5 (*)    All other components within normal limits  POC URINE PREG, ED   __  RADIOLOGY I, [place], personally viewed and evaluated these images (plain radiographs) as part of my medical decision making, as well as reviewing the written report by the radiologist.  Ct Abdomen Pelvis W Contrast  Result Date: 01/25/2017 CLINICAL DATA:  21 year old female with nausea, vomiting, diarrhea, and generalized abdominal pain. EXAM: CT ABDOMEN AND PELVIS WITH CONTRAST TECHNIQUE: Multidetector CT imaging of the abdomen and pelvis was performed using the standard protocol following bolus administration of intravenous contrast. CONTRAST:  100mL ISOVUE-300 IOPAMIDOL (ISOVUE-300) INJECTION 61% COMPARISON:  None. FINDINGS: Lower chest: The visualized lung bases are clear. No intra-abdominal free air or free fluid. Hepatobiliary: No focal liver abnormality is seen. No gallstones, gallbladder wall thickening, or biliary dilatation. Pancreas: Unremarkable. No pancreatic ductal dilatation or surrounding inflammatory changes. Spleen: Normal in size without focal abnormality. Adrenals/Urinary Tract: The adrenal glands, kidneys, and visualized ureters appear unremarkable. Minimal thickened appearance of the bladder wall. Correlation with urinalysis recommended to exclude UTI. Stomach/Bowel: Oral contrast opacifies the stomach and loops of small bowel traversing into the colon without evidence of bowel obstruction. Minimally thickened appearing loops of small bowel in the mid to lower  abdomen with mild associated mesenteric engorgement is concerning for mild enteritis. Correlation with clinical exam recommended. The appendix is normal. Vascular/Lymphatic: No significant vascular findings are present. No enlarged abdominal or pelvic lymph nodes. Reproductive: There the uterus is retroverted or retroflexed and appears unremarkable. The visualized ovaries are grossly unremarkable. Other: None Musculoskeletal: No acute or significant osseous findings. IMPRESSION: Findings concerning for mild enteritis. Correlation with clinical exam recommended. No evidence of bowel obstruction. Normal appendix. Electronically Signed   By: Elgie CollardArash  Radparvar M.D.   On: 01/25/2017 03:39      Procedures     INITIAL IMPRESSION / ASSESSMENT AND PLAN / ED COURSE  Pertinent labs & imaging results that were available during my care of the patient were reviewed by me and considered in my medical decision making (see chart for details).  Patient given IV Zofran in the emergency department and morphine with resolution of pain and vomiting. Patient given a liter of IV normal saline. CT scan revealed "normal appendix".    ____________________________________________  FINAL CLINICAL IMPRESSION(S) / ED DIAGNOSES  Final diagnoses:  Gastroenteritis     MEDICATIONS GIVEN DURING THIS VISIT:  Medications  morphine 2 MG/ML injection 2 mg (2 mg Intravenous Given 01/25/17 0111)  ondansetron (ZOFRAN) injection 4 mg (4 mg Intravenous Given 01/25/17 0110)  iopamidol (ISOVUE-300) 61 % injection 30 mL (30 mLs Oral Contrast Given 01/25/17 0116)  sodium chloride 0.9 % bolus 1,000 mL (1,000 mLs Intravenous New Bag/Given 01/25/17 0219)  ondansetron (ZOFRAN) injection 4 mg (4 mg Intravenous Given 01/25/17 0219)  iopamidol (ISOVUE-300) 61 % injection 100 mL (100 mLs Intravenous Contrast Given 01/25/17 0321)     NEW OUTPATIENT MEDICATIONS STARTED DURING THIS VISIT:  New Prescriptions   ONDANSETRON (ZOFRAN ODT) 4 MG  DISINTEGRATING TABLET    Take 1 tablet (4 mg total) by mouth every 8 (eight) hours as needed for nausea or vomiting.    Modified Medications   No medications on file    Discontinued Medications   No medications on file     Note:  This document was prepared using Dragon voice recognition software and may include unintentional dictation errors.    Darci Current, MD 01/25/17 [phone]

## 2017-08-21 ENCOUNTER — Emergency Department
Admission: EM | Admit: 2017-08-21 | Discharge: 2017-08-21 | Disposition: A | Discharge status: Home / Self Care | Payer: BLUE CROSS/BLUE SHIELD | Attending: Emergency Medicine | Admitting: Emergency Medicine

## 2017-08-21 ENCOUNTER — Encounter: Payer: Self-pay | Admitting: *Deleted

## 2017-08-21 DIAGNOSIS — Z87891 Personal history of nicotine dependence: Secondary | ICD-10-CM | POA: Insufficient documentation

## 2017-08-21 DIAGNOSIS — Z3A01 Less than 8 weeks gestation of pregnancy: Secondary | ICD-10-CM | POA: Diagnosis not present

## 2017-08-21 DIAGNOSIS — N390 Urinary tract infection, site not specified: Secondary | ICD-10-CM

## 2017-08-21 DIAGNOSIS — O2341 Unspecified infection of urinary tract in pregnancy, first trimester: Secondary | ICD-10-CM | POA: Insufficient documentation

## 2017-08-21 DIAGNOSIS — O210 Mild hyperemesis gravidarum: Secondary | ICD-10-CM | POA: Diagnosis not present

## 2017-08-21 DIAGNOSIS — O219 Vomiting of pregnancy, unspecified: Secondary | ICD-10-CM | POA: Diagnosis present

## 2017-08-21 DIAGNOSIS — Z880 Allergy status to penicillin: Secondary | ICD-10-CM | POA: Diagnosis not present

## 2017-08-21 LAB — COMPREHENSIVE METABOLIC PANEL
ALT: 18 U/L (ref 14–54)
AST: 19 U/L (ref 15–41)
Albumin: 4.6 g/dL (ref 3.5–5.0)
Alkaline Phosphatase: 54 U/L (ref 38–126)
Anion gap: 10 (ref 5–15)
BUN: 11 mg/dL (ref 6–20)
CHLORIDE: 101 mmol/L (ref 101–111)
CO2: 24 mmol/L (ref 22–32)
Calcium: 9.6 mg/dL (ref 8.9–10.3)
Creatinine, Ser: 0.76 mg/dL (ref 0.44–1.00)
GLUCOSE: 87 mg/dL (ref 65–99)
POTASSIUM: 4 mmol/L (ref 3.5–5.1)
Sodium: 135 mmol/L (ref 135–145)
TOTAL PROTEIN: 7.9 g/dL (ref 6.5–8.1)
Total Bilirubin: 2.4 mg/dL — ABNORMAL HIGH (ref 0.3–1.2)

## 2017-08-21 LAB — CBC
HEMATOCRIT: 39 % (ref 35.0–47.0)
HEMOGLOBIN: 13.2 g/dL (ref 12.0–16.0)
MCH: 28.1 pg (ref 26.0–34.0)
MCHC: 33.9 g/dL (ref 32.0–36.0)
MCV: 82.9 fL (ref 80.0–100.0)
Platelets: 395 10*3/uL (ref 150–440)
RBC: 4.71 MIL/uL (ref 3.80–5.20)
RDW: 12.5 % (ref 11.5–14.5)
WBC: 15.7 10*3/uL — ABNORMAL HIGH (ref 3.6–11.0)

## 2017-08-21 LAB — URINALYSIS, COMPLETE (UACMP) WITH MICROSCOPIC
BILIRUBIN URINE: NEGATIVE
Glucose, UA: NEGATIVE mg/dL
HGB URINE DIPSTICK: NEGATIVE
Ketones, ur: 80 mg/dL — AB
Leukocytes, UA: NEGATIVE
NITRITE: POSITIVE — AB
PROTEIN: 100 mg/dL — AB
Specific Gravity, Urine: 1.03 (ref 1.005–1.030)
pH: 6 (ref 5.0–8.0)

## 2017-08-21 LAB — HCG, QUANTITATIVE, PREGNANCY: HCG, BETA CHAIN, QUANT, S: 61558 m[IU]/mL — AB (ref <5)

## 2017-08-21 LAB — LIPASE, BLOOD: LIPASE: 18 U/L (ref 11–51)

## 2017-08-21 LAB — POCT PREGNANCY, URINE: PREG TEST UR: POSITIVE — AB

## 2017-08-21 MED ORDER — CEPHALEXIN 500 MG PO CAPS: 500.0000 mg | ORAL_CAPSULE | Freq: Two times a day (BID) | ORAL | 0 refills | Status: DC

## 2017-08-21 NOTE — ED Notes (Signed)
Pt states [redacted] weeks pregnant, 2nd pregnancy. 1st pregnancy miscarriage. States x 1 week has been throwing up. Denies blood in vomit. States seeing health department for OB care.

## 2017-08-21 NOTE — ED Triage Notes (Signed)
Pt is approx [redacted] weeks pregnant   Pt has vomiting and dizziness for several weeks.  No vag bleeding .  No dysuria   Pt alert.

## 2017-08-21 NOTE — Discharge Instructions (Signed)
Initially, take 20 mg doxylamine and 20 mg of pyridoxine orally at bedtime (Day 1). If this dose adequately controls symptoms the next day, continue taking the same dose daily at bedtime. However, if symptoms persist into the afternoon of Day 2, take the usual dose bedtime that night then add 10 mg of doxylamine and 10 mg of pyridoxine on the morning on Day 3 (continue taking usual nighttime dose)

## 2017-08-22 NOTE — ED Provider Notes (Signed)
[facility] Emergency Department Provider Note   ____________________________________________    I have reviewed the triage vital signs and the nursing notes.   HISTORY  Chief Complaint Emesis During Pregnancy     HPI Taylor Miller is a 21 y.o. female who presents with complaints of nausea and vomiting. Patient reports she is approximately [redacted] weeks pregnant. She reports daily nausea consistent with morning sickness. No abdominal pain. No vaginal bleeding. She hasn't taken anything for this. She has not seen OB yet. This is her second pregnancy.   Past Medical History:  Diagnosis Date  . Medical history non-contributory     Patient Active Problem List   Diagnosis Date Noted  . Missed abortion 05/16/2016    Past Surgical History:  Procedure Laterality Date  . DILATION AND CURETTAGE OF UTERUS N/A 05/18/2016   Procedure: SUCTION DILATATION AND CURETTAGE;  Surgeon: Tilda Burrow, MD;  Location: [facility];  Service: Gynecology;  Laterality: N/A;  . NO PAST SURGERIES      Prior to Admission medications   Medication Sig Start Date End Date Taking? Authorizing Provider  cephALEXin (KEFLEX) 500 MG capsule Take 1 capsule (500 mg total) by mouth 2 (two) times daily. 08/21/17   Jene Every, MD  ondansetron (ZOFRAN ODT) 4 MG disintegrating tablet Take 1 tablet (4 mg total) by mouth every 8 (eight) hours as needed for nausea or vomiting. 01/25/17   Darci Current, MD     Allergies Penicillins and Asa [aspirin]  No family history on file.  Social History Social History  Substance Use Topics  . Smoking status: Former Smoker    Quit date: 05/17/2015  . Smokeless tobacco: Never Used  . Alcohol use No    Review of Systems  Constitutional: No fever/chills Eyes: No visual changes.  ENT: No sore throat. Cardiovascular: Denies chest pain. Respiratory: Denies shortness of breath. Gastrointestinal: As above.   Genitourinary: Negative for dysuria.  No frequency Musculoskeletal: Negative for back pain. Skin: Negative for rash. Neurological: Negative for headaches or weakness   ____________________________________________   PHYSICAL EXAM:  VITAL SIGNS: ED Triage Vitals  Enc Vitals Group     BP 08/21/17 1740 (!) 105/43     Pulse Rate 08/21/17 1740 76     Resp 08/21/17 1740 16     Temp 08/21/17 1740 98.7 F (37.1 C)     Temp Source 08/21/17 1740 Oral     SpO2 08/21/17 1740 100 %     Weight 08/21/17 1740 63.1 kg (139 lb 2 oz)     Height 08/21/17 1740 1.575 m ( )     Head Circumference --      Peak Flow --      Pain Score 08/21/17 1742 6     Pain Loc --      Pain Edu? --      Excl. in GC? --     Constitutional: Alert and oriented. No acute distress. Pleasant and interactive Eyes: Conjunctivae are normal.   Nose: No congestion/rhinnorhea.  Cardiovascular: Normal rate, regular rhythm. Grossly normal heart sounds.  Good peripheral circulation. Respiratory: Normal respiratory effort.  No retractions. Lungs CTAB. Gastrointestinal: Soft and nontender. No distention.  No CVA tenderness. Genitourinary: deferred Musculoskeletal:  Warm and well perfused Neurologic:  Normal speech and language. No gross focal neurologic deficits are appreciated.  Skin:  Skin is warm, dry and intact. No rash noted. Psychiatric: Mood and affect are normal. Speech and behavior are normal.  ____________________________________________  LABS (all labs ordered are listed, but only abnormal results are displayed)  Labs Reviewed  COMPREHENSIVE METABOLIC PANEL - Abnormal; Notable for the following:       Result Value   Total Bilirubin 2.4 (*)    All other components within normal limits  CBC - Abnormal; Notable for the following:    WBC 15.7 (*)    All other components within normal limits  URINALYSIS, COMPLETE (UACMP) WITH MICROSCOPIC - Abnormal; Notable for the following:    Color, Urine AMBER (*)    APPearance HAZY (*)    Ketones, ur  80 (*)    Protein, ur 100 (*)    Nitrite POSITIVE (*)    Bacteria, UA RARE (*)    Squamous Epithelial / LPF 6-30 (*)    All other components within normal limits  HCG, QUANTITATIVE, PREGNANCY - Abnormal; Notable for the following:    hCG, Beta Chain, Quant, S 61,558 (*)    All other components within normal limits  POCT PREGNANCY, URINE - Abnormal; Notable for the following:    Preg Test, Ur POSITIVE (*)    All other components within normal limits  LIPASE, BLOOD  POC URINE PREG, ED   ____________________________________________  EKG  No ____________________________________________  RADIOLOGY  Nonee ____________________________________________   PROCEDURES  Procedure(s) performed: No    Critical Care performed: No ____________________________________________   INITIAL IMPRESSION / ASSESSMENT AND PLAN / ED COURSE  Pertinent labs & imaging results that were available during my care of the patient were reviewed by me and considered in my medical decision making (see chart for details).  Patient well-appearing and in no acute distress. Prior to test is positive. She has no abdominal pain. She is not actively vomiting and feels quite well at the moment. Lab work is overall reassuring, mildly elevated white blood cell count, the past she has also had elevated white blood cell count for unclear reasons. Do not feel this indicates infection given greatly reassuring exam.  Patient's symptoms are consistent with morning sickness, I'll prescribe antiemetics for her    ____________________________________________   FINAL CLINICAL IMPRESSION(S) / ED DIAGNOSES  Final diagnoses:  Morning sickness  Lower urinary tract infection      NEW MEDICATIONS STARTED DURING THIS VISIT:  Discharge Medication List as of 08/21/2017  7:20 PM    START taking these medications   Details  cephALEXin (KEFLEX) 500 MG capsule Take 1 capsule (500 mg total) by mouth 2 (two) times daily.,  Starting Mon 08/21/2017, Print         Note:  This document was prepared using Dragon voice recognition software and may include unintentional dictation errors.    Jene Every, MD 08/22/17 [phone]

## 2017-08-23 ENCOUNTER — Encounter (HOSPITAL_COMMUNITY): Payer: Self-pay

## 2017-08-23 ENCOUNTER — Emergency Department (HOSPITAL_COMMUNITY)
Admission: EM | Admit: 2017-08-23 | Discharge: 2017-08-23 | Disposition: A | Discharge status: Home / Self Care | Payer: BLUE CROSS/BLUE SHIELD | Attending: Emergency Medicine | Admitting: Emergency Medicine

## 2017-08-23 DIAGNOSIS — Z3A01 Less than 8 weeks gestation of pregnancy: Secondary | ICD-10-CM | POA: Insufficient documentation

## 2017-08-23 DIAGNOSIS — O2341 Unspecified infection of urinary tract in pregnancy, first trimester: Secondary | ICD-10-CM | POA: Insufficient documentation

## 2017-08-23 DIAGNOSIS — Z87891 Personal history of nicotine dependence: Secondary | ICD-10-CM | POA: Insufficient documentation

## 2017-08-23 DIAGNOSIS — R8271 Bacteriuria: Secondary | ICD-10-CM

## 2017-08-23 DIAGNOSIS — O210 Mild hyperemesis gravidarum: Secondary | ICD-10-CM | POA: Insufficient documentation

## 2017-08-23 DIAGNOSIS — R1110 Vomiting, unspecified: Secondary | ICD-10-CM

## 2017-08-23 LAB — CBC
HCT: 39.9 % (ref 36.0–46.0)
HEMOGLOBIN: 13.9 g/dL (ref 12.0–15.0)
MCH: 29 pg (ref 26.0–34.0)
MCHC: 34.8 g/dL (ref 30.0–36.0)
MCV: 83.1 fL (ref 78.0–100.0)
Platelets: 427 10*3/uL — ABNORMAL HIGH (ref 150–400)
RBC: 4.8 MIL/uL (ref 3.87–5.11)
RDW: 12.4 % (ref 11.5–15.5)
WBC: 17.7 10*3/uL — AB (ref 4.0–10.5)

## 2017-08-23 LAB — COMPREHENSIVE METABOLIC PANEL
ALBUMIN: 4.9 g/dL (ref 3.5–5.0)
ALK PHOS: 59 U/L (ref 38–126)
ALT: 23 U/L (ref 14–54)
ANION GAP: 9 (ref 5–15)
AST: 22 U/L (ref 15–41)
BILIRUBIN TOTAL: 2.2 mg/dL — AB (ref 0.3–1.2)
BUN: 12 mg/dL (ref 6–20)
CALCIUM: 9.6 mg/dL (ref 8.9–10.3)
CO2: 25 mmol/L (ref 22–32)
Chloride: 99 mmol/L — ABNORMAL LOW (ref 101–111)
Creatinine, Ser: 0.77 mg/dL (ref 0.44–1.00)
GFR calc Af Amer: >60 mL/min (ref >60)
GLUCOSE: 95 mg/dL (ref 65–99)
POTASSIUM: 3.3 mmol/L — AB (ref 3.5–5.1)
Sodium: 133 mmol/L — ABNORMAL LOW (ref 135–145)
TOTAL PROTEIN: 8.1 g/dL (ref 6.5–8.1)

## 2017-08-23 LAB — URINALYSIS, ROUTINE W REFLEX MICROSCOPIC
Bilirubin Urine: NEGATIVE
Glucose, UA: NEGATIVE mg/dL
Hgb urine dipstick: NEGATIVE
Ketones, ur: 20 mg/dL — AB
Nitrite: NEGATIVE
PROTEIN: 100 mg/dL — AB
Specific Gravity, Urine: 1.026 (ref 1.005–1.030)
pH: 5 (ref 5.0–8.0)

## 2017-08-23 LAB — LIPASE, BLOOD: Lipase: 19 U/L (ref 11–51)

## 2017-08-23 LAB — PREGNANCY, URINE: PREG TEST UR: POSITIVE — AB

## 2017-08-23 MED ORDER — ONDANSETRON HCL 4 MG/2ML IJ SOLN
4.0000 mg | Freq: Once | INTRAMUSCULAR | Status: AC
  Administered 2017-08-23: 4 mg via INTRAVENOUS
  Filled 2017-08-23: qty 2

## 2017-08-23 MED ORDER — SODIUM CHLORIDE 0.9 % IV BOLUS (SEPSIS)
1000.0000 mL | Freq: Once | INTRAVENOUS | Status: AC
  Administered 2017-08-23: 1000 mL via INTRAVENOUS

## 2017-08-23 MED ORDER — ONDANSETRON 4 MG PO TBDP: 4.0000 mg | ORAL_TABLET | Freq: Three times a day (TID) | ORAL | 0 refills | Status: DC | PRN

## 2017-08-23 NOTE — ED Triage Notes (Signed)
Pt reports is [redacted]weeks pregnant.  C/O abd pain, n/v/d x 4 days.

## 2017-08-23 NOTE — ED Notes (Signed)
Pt states she has her own water & has been taking sips already.

## 2017-08-23 NOTE — ED Notes (Signed)
Pt alert & oriented x4, stable gait. Patient given discharge instructions, paperwork & prescription(s). Patient  instructed to stop at the registration desk to finish any additional paperwork. Patient verbalized understanding. Pt left department w/ no further questions. 

## 2017-08-23 NOTE — ED Provider Notes (Signed)
Emergency Department Provider Note   I have reviewed the triage vital signs and the nursing notes.   HISTORY  Chief Complaint Emesis   HPI Taylor Miller is a 21 y.o. female G2P0 presents to the emergency department for evaluation of nausea and vomiting worsening over the last week. The patient states she's had intermittent vomiting since her pregnancy started but has been significantly worse. She estimates 7-8 episodes of vomiting per day. No blood. No diarrhea. He has mild to moderate bilateral abdominal cramping pain. No vaginal bleeding or discharge. No dysuria, hesitancy, urgency. In the emergency department yesterday and treated for asymptomatic bacteriuria. Patient has been taking Diclegis at home for nausea/vomiting without relief. Patient also complaining of moderate posterior HA. No vision changes. No sudden onset, maximal intensity HA symptoms.    Past Medical History:  Diagnosis Date  . Medical history non-contributory     Patient Active Problem List   Diagnosis Date Noted  . Missed abortion 05/16/2016    Past Surgical History:  Procedure Laterality Date  . DILATION AND CURETTAGE OF UTERUS N/A 05/18/2016   Procedure: SUCTION DILATATION AND CURETTAGE;  Surgeon: Tilda Burrow, MD;  Location: [facility];  Service: Gynecology;  Laterality: N/A;  . NO PAST SURGERIES      Current Outpatient Rx  . Order #: [id number] Class: Print  . Order #: [id number] Class: Print    Allergies Penicillins and Asa [aspirin]  No family history on file.  Social History Social History  Substance Use Topics  . Smoking status: Former Smoker    Quit date: 05/17/2015  . Smokeless tobacco: Never Used  . Alcohol use No    Review of Systems  Constitutional: No fever/chills Eyes: No visual changes. ENT: No sore throat. Cardiovascular: Denies chest pain. Respiratory: Denies shortness of breath. Gastrointestinal: Positive abdominal pain. Positive nausea and vomiting.  No diarrhea.  No  constipation. Genitourinary: Negative for dysuria. Musculoskeletal: Negative for back pain. Skin: Negative for rash. Neurological: Negative for headaches, focal weakness or numbness.  10-point ROS otherwise negative.  ____________________________________________   PHYSICAL EXAM:  VITAL SIGNS: ED Triage Vitals  Enc Vitals Group     BP 08/23/17 1855 133/81     Pulse Rate 08/23/17 1855 95     Resp 08/23/17 1855 18     Temp 08/23/17 1855 98.2 F (36.8 C)     Temp Source 08/23/17 1855 Oral     SpO2 08/23/17 1855 100 %     Weight 08/23/17 1854 137 lb (62.1 kg)     Height 08/23/17 1854  (1.575 m)     Pain Score 08/23/17 1855 6   Constitutional: Alert and oriented. Well appearing and in no acute distress. Eyes: Conjunctivae are normal.  Head: Atraumatic. Nose: No congestion/rhinnorhea. Mouth/Throat: Mucous membranes are slightly dry.  Neck: No stridor.   Cardiovascular: Normal rate, regular rhythm. Good peripheral circulation. Grossly normal heart sounds.   Respiratory: Normal respiratory effort.  No retractions. Lungs CTAB. Gastrointestinal: Soft and nontender. No distention.  Musculoskeletal: No lower extremity tenderness nor edema. No gross deformities of extremities. Neurologic:  Normal speech and language. No gross focal neurologic deficits are appreciated.  Skin:  Skin is warm, dry and intact. No rash noted.  ____________________________________________   LABS (all labs ordered are listed, but only abnormal results are displayed)  Labs Reviewed  COMPREHENSIVE METABOLIC PANEL - Abnormal; Notable for the following:       Result Value   Sodium 133 (*)    Potassium 3.3 (*)  Chloride 99 (*)    Total Bilirubin 2.2 (*)    All other components within normal limits  CBC - Abnormal; Notable for the following:    WBC 17.7 (*)    Platelets 427 (*)    All other components within normal limits  URINALYSIS, ROUTINE W REFLEX MICROSCOPIC - Abnormal; Notable for the  following:    Color, Urine AMBER (*)    APPearance CLOUDY (*)    Ketones, ur 20 (*)    Protein, ur 100 (*)    Leukocytes, UA TRACE (*)    Bacteria, UA FEW (*)    Squamous Epithelial / LPF 6-30 (*)    All other components within normal limits  PREGNANCY, URINE - Abnormal; Notable for the following:    Preg Test, Ur POSITIVE (*)    All other components within normal limits  LIPASE, BLOOD   ____________________________________________   PROCEDURES  Procedure(s) performed:   Procedures  None ____________________________________________   INITIAL IMPRESSION / ASSESSMENT AND PLAN / ED COURSE  Pertinent labs & imaging results that were available during my care of the patient were reviewed by me and considered in my medical decision making (see chart for details).  G2P0 Female presents to the emergency department for continued nausea and vomiting during pregnancy. She was seen in the emergency department yesterday and discharged home with Keflex for UTI and Diclegis. Continues to have nausea and vomiting at home. Abdomen is soft and nontender to palpation. Intact neurological exam. Normal BP. Suspect hyperemesis gravidarum clinically. Plan for IVF, Zofran, labs, and reassess.   Labs unremarkable. On reassessment the patient is more comfortable. Tolerating PO. Discussed the reported risks/benefits of nausea medication such as Zofran. Has tried Diclegis and continues to have symptoms. Advised starting the Keflex prescribed by prior ED, which she has not yet done. Will follow with OB. Patient understands risk/benefit conversation and would like Zofran Rx.   At this time, I do not feel there is any life-threatening condition present. I have reviewed and discussed all results (EKG, imaging, lab, urine as appropriate), exam findings with patient. I have reviewed nursing notes and appropriate previous records.  I feel the patient is safe to be discharged home without further emergent workup.  Discussed usual and customary return precautions. Patient and family (if present) verbalize understanding and are comfortable with this plan.  Patient will follow-up with their primary care provider. If they do not have a primary care provider, information for follow-up has been provided to them. All questions have been answered.  ____________________________________________  FINAL CLINICAL IMPRESSION(S) / ED DIAGNOSES  Final diagnoses:  Hyperemesis  Asymptomatic bacteriuria     MEDICATIONS GIVEN DURING THIS VISIT:  Medications  sodium chloride 0.9 % bolus 1,000 mL (0 mLs Intravenous Stopped 08/23/17 2047)  ondansetron (ZOFRAN) injection 4 mg (4 mg Intravenous Given 08/23/17 2009)     NEW OUTPATIENT MEDICATIONS STARTED DURING THIS VISIT:  None   Note:  This document was prepared using Dragon voice recognition software and may include unintentional dictation errors.  Alona Bene, MD Emergency Medicine    Kameelah Minish, Arlyss Repress, MD 08/23/17 2223

## 2017-08-23 NOTE — Discharge Instructions (Signed)
You were evaluated today for nausea and vomiting during pregnancy.  It is safe for you to go home and follow-up as an outpatient since you are feeling better.  Please take your regular medications (unless otherwise canceled in these papers) and any medications prescribed today according to the written instructions.  Return to the emergency department if he develop any new or worsening symptoms that concern you.  Hyperemesis Gravidarum Hyperemesis gravidarum is a severe form of nausea and vomiting that happens during pregnancy. Hyperemesis is worse than morning sickness. It may cause you to have nausea or vomiting all day for many days. It may keep you from eating and drinking enough food and liquids. Hyperemesis usually occurs during the first half (the first 20 weeks) of pregnancy. It often goes away once a woman is in her second half of pregnancy. However, sometimes hyperemesis continues through an entire pregnancy.  CAUSES  The cause of this condition is not completely known but is thought to be related to changes in the body's hormones when pregnant. It could be from the high level of the pregnancy hormone or an increase in estrogen in the body.  SIGNS AND SYMPTOMS  Severe nausea and vomiting. Nausea that does not go away. Vomiting that does not allow you to keep any food down. Weight loss and body fluid loss (dehydration). Having no desire to eat or not liking food you have previously enjoyed. DIAGNOSIS  Your health care provider will do a physical exam and ask you about your symptoms. He or she may also order blood tests and urine tests to make sure something else is not causing the problem.  TREATMENT  You may only need medicine to control the problem. If medicines do not control the nausea and vomiting, you will be treated in the hospital to prevent dehydration, increased acid in the blood (acidosis), weight loss, and changes in the electrolytes in your body that may harm the unborn baby  (fetus). You may need IV fluids.  HOME CARE INSTRUCTIONS  Only take over-the-counter or prescription medicines as directed by your health care provider. Try eating a couple of dry crackers or toast in the morning before getting out of bed. Avoid foods and smells that upset your stomach. Avoid fatty and spicy foods. Eat 5-6 small meals a day. Do not drink when eating meals. Drink between meals. For snacks, eat high-protein foods, such as cheese. Eat or suck on things that have ginger in them. Ginger helps nausea. Avoid food preparation. The smell of food can spoil your appetite. Avoid iron pills and iron in your multivitamins until after 3-4 months of being pregnant. However, consult with your health care provider before stopping any prescribed iron pills. SEEK MEDICAL CARE IF:  Your abdominal pain increases. You have a severe headache. You have vision problems. You are losing weight. SEEK IMMEDIATE MEDICAL CARE IF:  You are unable to keep fluids down. You vomit blood. You have constant nausea and vomiting. You have excessive weakness. You have extreme thirst. You have dizziness or fainting. You have a fever or persistent symptoms for more than 2-3 days. You have a fever and your symptoms suddenly get worse. MAKE SURE YOU:  Understand these instructions. Will watch your condition. Will get help right away if you are not doing well or get worse.   This information is not intended to replace advice given to you by your health care provider. Make sure you discuss any questions you have with your health care provider.  Document Released: 10/31/2005 Document Revised: 08/21/2013 Document Reviewed: 06/12/2013 Elsevier Interactive Patient Education [employer]

## 2017-08-26 ENCOUNTER — Emergency Department (HOSPITAL_COMMUNITY)
Admission: EM | Admit: 2017-08-26 | Discharge: 2017-08-27 | Disposition: A | Discharge status: Home / Self Care | Payer: BLUE CROSS/BLUE SHIELD | Attending: Emergency Medicine | Admitting: Emergency Medicine

## 2017-08-26 ENCOUNTER — Encounter (HOSPITAL_COMMUNITY): Payer: Self-pay

## 2017-08-26 DIAGNOSIS — Z3A01 Less than 8 weeks gestation of pregnancy: Secondary | ICD-10-CM | POA: Insufficient documentation

## 2017-08-26 DIAGNOSIS — Z87891 Personal history of nicotine dependence: Secondary | ICD-10-CM | POA: Diagnosis not present

## 2017-08-26 DIAGNOSIS — O218 Other vomiting complicating pregnancy: Secondary | ICD-10-CM | POA: Insufficient documentation

## 2017-08-26 DIAGNOSIS — O26891 Other specified pregnancy related conditions, first trimester: Secondary | ICD-10-CM | POA: Insufficient documentation

## 2017-08-26 DIAGNOSIS — R102 Pelvic and perineal pain: Secondary | ICD-10-CM | POA: Insufficient documentation

## 2017-08-26 DIAGNOSIS — O219 Vomiting of pregnancy, unspecified: Secondary | ICD-10-CM

## 2017-08-26 LAB — CBC
HCT: 37.4 % (ref 36.0–46.0)
HEMOGLOBIN: 13.1 g/dL (ref 12.0–15.0)
MCH: 29.1 pg (ref 26.0–34.0)
MCHC: 35 g/dL (ref 30.0–36.0)
MCV: 83.1 fL (ref 78.0–100.0)
Platelets: 354 10*3/uL (ref 150–400)
RBC: 4.5 MIL/uL (ref 3.87–5.11)
RDW: 12.2 % (ref 11.5–15.5)
WBC: 16.8 10*3/uL — ABNORMAL HIGH (ref 4.0–10.5)

## 2017-08-26 LAB — COMPREHENSIVE METABOLIC PANEL
ALT: 21 U/L (ref 14–54)
ANION GAP: 11 (ref 5–15)
AST: 20 U/L (ref 15–41)
Albumin: 4.5 g/dL (ref 3.5–5.0)
Alkaline Phosphatase: 52 U/L (ref 38–126)
BUN: 12 mg/dL (ref 6–20)
CHLORIDE: 97 mmol/L — AB (ref 101–111)
CO2: 25 mmol/L (ref 22–32)
Calcium: 9.3 mg/dL (ref 8.9–10.3)
Creatinine, Ser: 0.67 mg/dL (ref 0.44–1.00)
GFR calc non Af Amer: >60 mL/min (ref >60)
Glucose, Bld: 111 mg/dL — ABNORMAL HIGH (ref 65–99)
Potassium: 3.2 mmol/L — ABNORMAL LOW (ref 3.5–5.1)
Sodium: 133 mmol/L — ABNORMAL LOW (ref 135–145)
Total Bilirubin: 2.4 mg/dL — ABNORMAL HIGH (ref 0.3–1.2)
Total Protein: 7.6 g/dL (ref 6.5–8.1)

## 2017-08-26 LAB — LIPASE, BLOOD: LIPASE: 15 U/L (ref 11–51)

## 2017-08-26 MED ORDER — METOCLOPRAMIDE HCL 5 MG/ML IJ SOLN
10.0000 mg | Freq: Once | INTRAMUSCULAR | Status: AC
  Administered 2017-08-26: 10 mg via INTRAVENOUS
  Filled 2017-08-26: qty 2

## 2017-08-26 MED ORDER — SODIUM CHLORIDE 0.9 % IV BOLUS (SEPSIS)
1000.0000 mL | Freq: Once | INTRAVENOUS | Status: AC
  Administered 2017-08-26: 1000 mL via INTRAVENOUS

## 2017-08-26 NOTE — ED Triage Notes (Signed)
Pt reports being seen here about a week ago for the same complaint. Pt reports abd pain with nausea and vomiting. Pt is currently [redacted] weeks pregnant and has had prenatal care.

## 2017-08-26 NOTE — ED Provider Notes (Signed)
[facility] Provider Note   CSN: [id number] Arrival date & time: 08/26/17  2204     History   Chief Complaint Chief Complaint  Patient presents with  . Abdominal Pain    [redacted] weeks pregnant     HPI Taylor Miller is a 21 y.o. female.  Patient presents to the emergency department for evaluation of nausea and vomiting. Patient reports that she is approximately [redacted] weeks pregnant. This is her third ER visit for intractable nausea and vomiting. She has been given Zofran in the ER and she is at home but has not helped. She reports that she has tried ginger and other over-the-counter remedies as well. She is not able to eat or drink because she keeps vomiting. She has some pain in the left side of her abdomen as well. This is sharp and stabbing, intermittent. No vaginal discharge or bleeding.      Past Medical History:  Diagnosis Date  . Medical history non-contributory     Patient Active Problem List   Diagnosis Date Noted  . Missed abortion 05/16/2016    Past Surgical History:  Procedure Laterality Date  . DILATION AND CURETTAGE OF UTERUS N/A 05/18/2016   Procedure: SUCTION DILATATION AND CURETTAGE;  Surgeon: Tilda Burrow, MD;  Location: [facility];  Service: Gynecology;  Laterality: N/A;  . NO PAST SURGERIES      OB History    Gravida Para Term Preterm AB Living   1             SAB TAB Ectopic Multiple Live Births                   Home Medications    Prior to Admission medications   Medication Sig Start Date End Date Taking? Authorizing Provider  cephALEXin (KEFLEX) 500 MG capsule Take 1 capsule (500 mg total) by mouth 2 (two) times daily. 08/21/17   Jene Every, MD  Doxylamine-Pyridoxine (DICLEGIS) 10-10 MG TBEC Take 2 tablets by mouth at bedtime as needed. 08/27/17   Gilda Crease, MD  metoCLOPramide (REGLAN) 10 MG tablet Take 1 tablet (10 mg total) by mouth every 6 (six) hours as needed for nausea (nausea/headache). 08/27/17   Gilda Crease, MD  ondansetron (ZOFRAN ODT) 4 MG disintegrating tablet Take 1 tablet (4 mg total) by mouth every 8 (eight) hours as needed for nausea or vomiting. 08/23/17   Long, Arlyss Repress, MD  promethazine (PHENERGAN) 25 MG suppository Place 1 suppository (25 mg total) rectally every 6 (six) hours as needed for nausea or vomiting. 08/27/17   Gilda Crease, MD    Family History No family history on file.  Social History Social History  Substance Use Topics  . Smoking status: Former Smoker    Quit date: 05/17/2015  . Smokeless tobacco: Never Used  . Alcohol use No     Allergies   Penicillins and Asa [aspirin]   Review of Systems Review of Systems  Gastrointestinal: Positive for nausea and vomiting.  All other systems reviewed and are negative.    Physical Exam Updated Vital Signs BP 126/75 (BP Location: Right Arm)   Pulse 90   Temp 98.4 F (36.9 C) (Oral)   Resp 18   Ht  (1.575 m)   Wt 62.1 kg (137 lb)   LMP 07/12/2017 (Approximate)   SpO2 99%   BMI 25.06 kg/m   Physical Exam  Constitutional: She is oriented to person, place, and time. She appears well-developed and  well-nourished. No distress.  HENT:  Head: Normocephalic and atraumatic.  Right Ear: Hearing normal.  Left Ear: Hearing normal.  Nose: Nose normal.  Mouth/Throat: Oropharynx is clear and moist and mucous membranes are normal.  Eyes: Pupils are equal, round, and reactive to light. Conjunctivae and EOM are normal.  Neck: Normal range of motion. Neck supple.  Cardiovascular: Regular rhythm, S1 normal and S2 normal.  Exam reveals no gallop and no friction rub.   No murmur heard. Pulmonary/Chest: Effort normal and breath sounds normal. No respiratory distress. She exhibits no tenderness.  Abdominal: Soft. Normal appearance and bowel sounds are normal. There is no hepatosplenomegaly. There is no tenderness. There is no rebound, no guarding, no tenderness at McBurney's point and negative  Murphy's sign. No hernia.  Musculoskeletal: Normal range of motion.  Neurological: She is alert and oriented to person, place, and time. She has normal strength. No cranial nerve deficit or sensory deficit. Coordination normal. GCS eye subscore is 4. GCS verbal subscore is 5. GCS motor subscore is 6.  Skin: Skin is warm, dry and intact. No rash noted. No cyanosis.  Psychiatric: She has a normal mood and affect. Her speech is normal and behavior is normal. Thought content normal.  Nursing note and vitals reviewed.    ED Treatments / Results  Labs (all labs ordered are listed, but only abnormal results are displayed) Labs Reviewed  COMPREHENSIVE METABOLIC PANEL - Abnormal; Notable for the following:       Result Value   Sodium 133 (*)    Potassium 3.2 (*)    Chloride 97 (*)    Glucose, Bld 111 (*)    Total Bilirubin 2.4 (*)    All other components within normal limits  CBC - Abnormal; Notable for the following:    WBC 16.8 (*)    All other components within normal limits  URINALYSIS, ROUTINE W REFLEX MICROSCOPIC - Abnormal; Notable for the following:    APPearance HAZY (*)    Ketones, ur 80 (*)    Protein, ur 30 (*)    Leukocytes, UA TRACE (*)    Bacteria, UA RARE (*)    Squamous Epithelial / LPF 6-30 (*)    All other components within normal limits  HCG, QUANTITATIVE, PREGNANCY - Abnormal; Notable for the following:    hCG, Beta Chain, Quant, S 112,018 (*)    All other components within normal limits  LIPASE, BLOOD    EKG  EKG Interpretation None       Radiology No results found.  Procedures Procedures (including critical care time)  Medications Ordered in ED Medications  sodium chloride 0.9 % bolus 1,000 mL (0 mLs Intravenous Stopped 08/27/17 0001)  metoCLOPramide (REGLAN) injection 10 mg (10 mg Intravenous Given 08/26/17 2313)     Initial Impression / Assessment and Plan / ED Course  I have reviewed the triage vital signs and the nursing notes.  Pertinent  labs & imaging results that were available during my care of the patient were reviewed by me and considered in my medical decision making (see chart for details).     Patient presents to the emergency department for evaluation of nausea and vomiting in early pregnancy. She has been experiencing intermittent pelvic cramping. She has been seen twice before for similar symptoms. Beta hCG was 61,000 on 10/8, and is 112,000 today. This is likely appropriate increase, have low suspicion for ectopic pregnancy at this time. Will have patient return in morning for pelvic ultrasound to ensure that  there is no ectopic or obstetric abnormality, but does not require emergent ultrasound in the middle of the night. Patient treated with IV fluids and Reglan.  Final Clinical Impressions(s) / ED Diagnoses   Final diagnoses:  Excessive vomiting in pregnancy    New Prescriptions New Prescriptions   DOXYLAMINE-PYRIDOXINE (DICLEGIS) 10-10 MG TBEC    Take 2 tablets by mouth at bedtime as needed.   METOCLOPRAMIDE (REGLAN) 10 MG TABLET    Take 1 tablet (10 mg total) by mouth every 6 (six) hours as needed for nausea (nausea/headache).   PROMETHAZINE (PHENERGAN) 25 MG SUPPOSITORY    Place 1 suppository (25 mg total) rectally every 6 (six) hours as needed for nausea or vomiting.     Gilda Crease, MD 08/27/17 [id number]

## 2017-08-27 ENCOUNTER — Inpatient Hospital Stay (HOSPITAL_COMMUNITY): Admit: 2017-08-27 | Payer: BLUE CROSS/BLUE SHIELD

## 2017-08-27 LAB — HCG, QUANTITATIVE, PREGNANCY: hCG, Beta Chain, Quant, S: 112018 m[IU]/mL — ABNORMAL HIGH (ref <5)

## 2017-08-27 LAB — URINALYSIS, ROUTINE W REFLEX MICROSCOPIC
BILIRUBIN URINE: NEGATIVE
GLUCOSE, UA: NEGATIVE mg/dL
Hgb urine dipstick: NEGATIVE
Ketones, ur: 80 mg/dL — AB
Nitrite: NEGATIVE
PH: 5 (ref 5.0–8.0)
Protein, ur: 30 mg/dL — AB
SPECIFIC GRAVITY, URINE: 1.029 (ref 1.005–1.030)

## 2017-08-27 MED ORDER — PROMETHAZINE HCL 25 MG RE SUPP: 25.0000 mg | Freq: Four times a day (QID) | RECTAL | 0 refills | Status: DC | PRN

## 2017-08-27 MED ORDER — DOXYLAMINE-PYRIDOXINE 10-10 MG PO TBEC: 2.0000 | DELAYED_RELEASE_TABLET | Freq: Every evening | ORAL | 0 refills | Status: DC | PRN

## 2017-08-27 MED ORDER — METOCLOPRAMIDE HCL 10 MG PO TABS: 10.0000 mg | ORAL_TABLET | Freq: Four times a day (QID) | ORAL | 0 refills | Status: DC | PRN

## 2017-08-27 NOTE — ED Notes (Signed)
Pt alert & oriented x4, stable gait. Patient given discharge instructions, paperwork & prescription(s). Patient  instructed to stop at the registration desk to finish any additional paperwork. Patient verbalized understanding. Pt left department w/ no further questions. 

## 2020-07-26 ENCOUNTER — Other Ambulatory Visit: Payer: Self-pay

## 2020-07-26 ENCOUNTER — Emergency Department
Admission: EM | Admit: 2020-07-26 | Discharge: 2020-07-26 | Disposition: A | Discharge status: Home / Self Care | Payer: BC Managed Care – PPO | Attending: Emergency Medicine | Admitting: Emergency Medicine

## 2020-07-26 ENCOUNTER — Encounter: Payer: Self-pay | Admitting: Emergency Medicine

## 2020-07-26 ENCOUNTER — Emergency Department: Payer: BC Managed Care – PPO

## 2020-07-26 DIAGNOSIS — Z87891 Personal history of nicotine dependence: Secondary | ICD-10-CM | POA: Diagnosis not present

## 2020-07-26 DIAGNOSIS — Z20822 Contact with and (suspected) exposure to covid-19: Secondary | ICD-10-CM | POA: Insufficient documentation

## 2020-07-26 DIAGNOSIS — R1011 Right upper quadrant pain: Secondary | ICD-10-CM | POA: Diagnosis not present

## 2020-07-26 DIAGNOSIS — Z79899 Other long term (current) drug therapy: Secondary | ICD-10-CM | POA: Insufficient documentation

## 2020-07-26 DIAGNOSIS — R1110 Vomiting, unspecified: Secondary | ICD-10-CM | POA: Insufficient documentation

## 2020-07-26 LAB — URINALYSIS, COMPLETE (UACMP) WITH MICROSCOPIC
Bilirubin Urine: NEGATIVE
Glucose, UA: NEGATIVE mg/dL
Ketones, ur: 80 mg/dL — AB
Nitrite: NEGATIVE
Protein, ur: 100 mg/dL — AB
Specific Gravity, Urine: 1.03 (ref 1.005–1.030)
pH: 6 (ref 5.0–8.0)

## 2020-07-26 LAB — URINE DRUG SCREEN, QUALITATIVE (ARMC ONLY)
Amphetamines, Ur Screen: NOT DETECTED
Barbiturates, Ur Screen: NOT DETECTED
Benzodiazepine, Ur Scrn: NOT DETECTED
Cannabinoid 50 Ng, Ur ~~LOC~~: POSITIVE — AB
Cocaine Metabolite,Ur ~~LOC~~: NOT DETECTED
MDMA (Ecstasy)Ur Screen: NOT DETECTED
Methadone Scn, Ur: NOT DETECTED
Opiate, Ur Screen: NOT DETECTED
Phencyclidine (PCP) Ur S: NOT DETECTED
Tricyclic, Ur Screen: NOT DETECTED

## 2020-07-26 LAB — SARS CORONAVIRUS 2 BY RT PCR (HOSPITAL ORDER, PERFORMED IN ~~LOC~~ HOSPITAL LAB): SARS Coronavirus 2: NEGATIVE

## 2020-07-26 LAB — CBC
HCT: 40.3 % (ref 36.0–46.0)
Hemoglobin: 14.4 g/dL (ref 12.0–15.0)
MCH: 29.3 pg (ref 26.0–34.0)
MCHC: 35.7 g/dL (ref 30.0–36.0)
MCV: 82.1 fL (ref 80.0–100.0)
Platelets: 382 10*3/uL (ref 150–400)
RBC: 4.91 MIL/uL (ref 3.87–5.11)
RDW: 12.8 % (ref 11.5–15.5)
WBC: 17.6 10*3/uL — ABNORMAL HIGH (ref 4.0–10.5)
nRBC: 0 % (ref 0.0–0.2)

## 2020-07-26 LAB — COMPREHENSIVE METABOLIC PANEL
ALT: 22 U/L (ref 0–44)
AST: 26 U/L (ref 15–41)
Albumin: 5.1 g/dL — ABNORMAL HIGH (ref 3.5–5.0)
Alkaline Phosphatase: 56 U/L (ref 38–126)
Anion gap: 14 (ref 5–15)
BUN: 18 mg/dL (ref 6–20)
CO2: 24 mmol/L (ref 22–32)
Calcium: 10.2 mg/dL (ref 8.9–10.3)
Chloride: 101 mmol/L (ref 98–111)
Creatinine, Ser: 0.78 mg/dL (ref 0.44–1.00)
GFR calc Af Amer: >60 mL/min (ref >60)
GFR calc non Af Amer: >60 mL/min (ref >60)
Glucose, Bld: 103 mg/dL — ABNORMAL HIGH (ref 70–99)
Potassium: 4 mmol/L (ref 3.5–5.1)
Sodium: 139 mmol/L (ref 135–145)
Total Bilirubin: 4.4 mg/dL — ABNORMAL HIGH (ref 0.3–1.2)
Total Protein: 8.2 g/dL — ABNORMAL HIGH (ref 6.5–8.1)

## 2020-07-26 LAB — LIPASE, BLOOD: Lipase: 19 U/L (ref 11–51)

## 2020-07-26 LAB — POCT PREGNANCY, URINE: Preg Test, Ur: NEGATIVE

## 2020-07-26 MED ORDER — ONDANSETRON 4 MG PO TBDP: 4.0000 mg | ORAL_TABLET | Freq: Three times a day (TID) | ORAL | 0 refills | Status: DC | PRN

## 2020-07-26 MED ORDER — PROMETHAZINE HCL 25 MG/ML IJ SOLN
25.0000 mg | Freq: Once | INTRAMUSCULAR | Status: AC
  Administered 2020-07-26: 25 mg via INTRAVENOUS
  Filled 2020-07-26: qty 1

## 2020-07-26 MED ORDER — ONDANSETRON 4 MG PO TBDP: 4.0000 mg | ORAL_TABLET | Freq: Once | ORAL | Status: DC | PRN

## 2020-07-26 MED ORDER — SODIUM CHLORIDE 0.9 % IV BOLUS
1000.0000 mL | Freq: Once | INTRAVENOUS | Status: AC
  Administered 2020-07-26: 1000 mL via INTRAVENOUS

## 2020-07-26 MED ORDER — MORPHINE SULFATE (PF) 4 MG/ML IV SOLN
4.0000 mg | Freq: Once | INTRAVENOUS | Status: AC
  Administered 2020-07-26: 4 mg via INTRAVENOUS
  Filled 2020-07-26: qty 1

## 2020-07-26 NOTE — ED Provider Notes (Signed)
[facility] Emergency Department Provider Note  ____________________________________________   First MD Initiated Contact with Patient 07/26/20 1559     (approximate)  I have reviewed the triage vital signs and the nursing notes.   HISTORY  Chief Complaint Abdominal Pain and Emesis    HPI Taylor Miller is a 24 y.o. female presents emergency department with vomiting since Friday.  Patient states she had Covid 3 months ago.  Patient states she was drinking and had approximately 3 drinks however her boyfriend says it was more like 8 and began vomiting and has not stopped.  No known fever or chills.  No burning with urination.    Past Medical History:  Diagnosis Date  . Medical history non-contributory     Patient Active Problem List   Diagnosis Date Noted  . Missed abortion 05/16/2016    Past Surgical History:  Procedure Laterality Date  . DILATION AND CURETTAGE OF UTERUS N/A 05/18/2016   Procedure: SUCTION DILATATION AND CURETTAGE;  Surgeon: Tilda Burrow, MD;  Location: [facility];  Service: Gynecology;  Laterality: N/A;  . NO PAST SURGERIES      Prior to Admission medications   Medication Sig Start Date End Date Taking? Authorizing Provider  ondansetron (ZOFRAN-ODT) 4 MG disintegrating tablet Take 1 tablet (4 mg total) by mouth every 8 (eight) hours as needed. 07/26/20   Vella Colquitt, Roselyn Bering, PA-C  metoCLOPramide (REGLAN) 10 MG tablet Take 1 tablet (10 mg total) by mouth every 6 (six) hours as needed for nausea (nausea/headache). 08/27/17 07/26/20  Gilda Crease, MD  promethazine (PHENERGAN) 25 MG suppository Place 1 suppository (25 mg total) rectally every 6 (six) hours as needed for nausea or vomiting. 08/27/17 07/26/20  Gilda Crease, MD    Allergies Aspirin and Penicillins  History reviewed. No pertinent family history.  Social History Social History   Tobacco Use  . Smoking status: Former Smoker    Quit date: 05/17/2015     Years since quitting: 5.1  . Smokeless tobacco: Never Used  Substance Use Topics  . Alcohol use: No  . Drug use: No    Review of Systems  Constitutional: No fever/chills Eyes: No visual changes. ENT: No sore throat. Respiratory: Denies cough Cardiovascular: Denies chest pain Gastrointestinal: Positive abdominal pain and vomiting, no diarrhea Genitourinary: Negative for dysuria. Musculoskeletal: Negative for back pain. Skin: Negative for rash. Psychiatric: no mood changes,     ____________________________________________   PHYSICAL EXAM:  VITAL SIGNS: ED Triage Vitals  Enc Vitals Group     BP 07/26/20 1342 103/64     Pulse Rate 07/26/20 1342 83     Resp 07/26/20 1342 16     Temp 07/26/20 1342 98.5 F (36.9 C)     Temp Source 07/26/20 1342 Oral     SpO2 07/26/20 1342 99 %     Weight 07/26/20 1342 133 lb (60.3 kg)     Height 07/26/20 1342 5\' 2"  (1.575 m)     Head Circumference --      Peak Flow --      Pain Score 07/26/20 1349 10     Pain Loc --      Pain Edu? --      Excl. in GC? --     Constitutional: Alert and oriented. Well appearing and in no acute distress. Eyes: Conjunctivae are normal.  Head: Atraumatic. Nose: No congestion/rhinnorhea. Mouth/Throat: Mucous membranes are moist.   Neck:  supple no lymphadenopathy noted Cardiovascular: Normal rate, regular  rhythm. Heart sounds are normal Respiratory: Normal respiratory effort.  No retractions, lungs c t a  Abd: soft tender in the right upper quadrant, bs normal all 4 quad GU: deferred Musculoskeletal: FROM all extremities, warm and well perfused Neurologic:  Normal speech and language.  Skin:  Skin is warm, dry and intact. No rash noted. Psychiatric: Mood and affect are normal. Speech and behavior are normal.  ____________________________________________   LABS (all labs ordered are listed, but only abnormal results are displayed)  Labs Reviewed  COMPREHENSIVE METABOLIC PANEL - Abnormal; Notable  for the following components:      Result Value   Glucose, Bld 103 (*)    Total Protein 8.2 (*)    Albumin 5.1 (*)    Total Bilirubin 4.4 (*)    All other components within normal limits  CBC - Abnormal; Notable for the following components:   WBC 17.6 (*)    All other components within normal limits  URINALYSIS, COMPLETE (UACMP) WITH MICROSCOPIC - Abnormal; Notable for the following components:   Color, Urine YELLOW (*)    APPearance CLOUDY (*)    Hgb urine dipstick MODERATE (*)    Ketones, ur 80 (*)    Protein, ur 100 (*)    Leukocytes,Ua TRACE (*)    Bacteria, UA RARE (*)    All other components within normal limits  URINE DRUG SCREEN, QUALITATIVE ([facility] ONLY) - Abnormal; Notable for the following components:   Cannabinoid 50 Ng, Ur [place] POSITIVE (*)    All other components within normal limits  SARS CORONAVIRUS 2 BY RT PCR (HOSPITAL ORDER, PERFORMED IN [facility])  LIPASE, BLOOD  POC URINE PREG, ED  POCT PREGNANCY, URINE   ____________________________________________   ____________________________________________  RADIOLOGY  Ultrasound right upper quadrant  ____________________________________________   PROCEDURES  Procedure(s) performed: No  Procedures    ____________________________________________   INITIAL IMPRESSION / ASSESSMENT AND PLAN / ED COURSE  Pertinent labs & imaging results that were available during my care of the patient were reviewed by me and considered in my medical decision making (see chart for details).   Patient is 24 year old female presents emergency department with abdominal pain and vomiting.  See HPI.  Physical exam shows vitals to be stable.  Patient is tearful and crying stating her stomach hurts.  Abdomen is tender in the right upper quadrant.  DDx: Acute pancreatitis, acute cholecystitis, acute appendicitis, acute gastroenteritis  CBC has elevated WBC of 17.6, comprehensive metabolic panel has increased bili's of  4.4, urinalysis has moderate amount of hemoglobin, 80 ketones, trace leuks, and rare bacteria,  POC pregnancy is negative, UDS shows cannabinoids  Did discuss findings with patient the ultrasound was negative for acute cholecystitis.  I do not feel she has acute appendicitis that she is not tender in the right lower quadrant.  She does not have acute pancreatitis as her lipase is normal.  Not sure if she has excessive vomiting due to the marijuana use or alcohol use.  Could be either 1.  She would also like to have Covid test.  I told her that due to the fact that she is less than 3 months out she could still have a positive test.  She states she was still like to have one.  So we did order Covid.  ----------------------------------------- 6:02 PM on 07/26/2020 -----------------------------------------  Patient states she is feeling better.  Abdominal pain is subsiding.  Patient is still tender in the left lower quadrant and right upper quadrant.  She states pain is at 4-5 over 10.  Vomiting has subsided.  Covid test still pending.  However feel that she can be discharged with this test pending.  We can call her with the results.  She was given strict instructions to return if increasing abdominal pain.  Explained to her that her WBCs are high and therefore we always worry about infection.  She states she understands.  She will return if worsening.     Taylor Miller was evaluated in Emergency Department on 07/26/2020 for the symptoms described in the history of present illness. She was evaluated in the context of the global COVID-19 pandemic, which necessitated consideration that the patient might be at risk for infection with the SARS-CoV-2 virus that causes COVID-19. Institutional protocols and algorithms that pertain to the evaluation of patients at risk for COVID-19 are in a state of rapid change based on information released by regulatory bodies including the CDC and federal and state  organizations. These policies and algorithms were followed during the patient's care in the ED.    As part of my medical decision making, I reviewed the following data within the electronic MEDICAL RECORD NUMBER Nursing notes reviewed and incorporated, Labs reviewed , Old chart reviewed, Radiograph reviewed , Notes from prior ED visits and [place] Controlled Substance Database  ____________________________________________   FINAL CLINICAL IMPRESSION(S) / ED DIAGNOSES  Final diagnoses:  RUQ pain  Vomiting in adult  Right upper quadrant abdominal pain      NEW MEDICATIONS STARTED DURING THIS VISIT:  New Prescriptions   ONDANSETRON (ZOFRAN-ODT) 4 MG DISINTEGRATING TABLET    Take 1 tablet (4 mg total) by mouth every 8 (eight) hours as needed.     Note:  This document was prepared using Dragon voice recognition software and may include unintentional dictation errors.    Faythe Ghee, PA-C 07/26/20 Aldona Lento    Jene Every, MD 07/26/20 [phone]

## 2020-07-26 NOTE — Discharge Instructions (Signed)
Follow-up with your regular doctor if not improving in 3 to 4 days.  Return emergency department if increasing abdominal pain, fever, chills or more vomiting. The Zofran ODT for nausea/vomiting.  This will [place] your tongue seated even if you vomit afterwards it will be in your system.

## 2020-07-26 NOTE — ED Triage Notes (Signed)
First RN Note: pt states symptoms since Friday. Pt states has had repeated episodes of vomiting, pt states intermittent episodes of numbness to arms and legs.

## 2020-07-26 NOTE — ED Triage Notes (Signed)
Pt arrived via POV with c/o abdominal pain and vomiting since Friday after drinking 3 alcoholic drinks.  Pt tearful in triage. States she can't keep anything on her stomach.

## 2020-07-26 NOTE — ED Notes (Signed)
Offered Zofran, but pt declined states it didn't help previously.

## 2020-07-27 ENCOUNTER — Encounter: Payer: Self-pay | Admitting: Emergency Medicine

## 2020-07-27 ENCOUNTER — Emergency Department: Payer: BC Managed Care – PPO

## 2020-07-27 ENCOUNTER — Emergency Department
Admission: EM | Admit: 2020-07-27 | Discharge: 2020-07-27 | Disposition: A | Discharge status: Home / Self Care | Payer: BC Managed Care – PPO | Attending: Emergency Medicine | Admitting: Emergency Medicine

## 2020-07-27 DIAGNOSIS — N739 Female pelvic inflammatory disease, unspecified: Secondary | ICD-10-CM | POA: Diagnosis not present

## 2020-07-27 DIAGNOSIS — N730 Acute parametritis and pelvic cellulitis: Secondary | ICD-10-CM

## 2020-07-27 DIAGNOSIS — Z87891 Personal history of nicotine dependence: Secondary | ICD-10-CM | POA: Diagnosis not present

## 2020-07-27 DIAGNOSIS — N760 Acute vaginitis: Secondary | ICD-10-CM

## 2020-07-27 DIAGNOSIS — B9689 Other specified bacterial agents as the cause of diseases classified elsewhere: Secondary | ICD-10-CM | POA: Diagnosis not present

## 2020-07-27 DIAGNOSIS — R1084 Generalized abdominal pain: Secondary | ICD-10-CM | POA: Diagnosis present

## 2020-07-27 DIAGNOSIS — Z79899 Other long term (current) drug therapy: Secondary | ICD-10-CM | POA: Diagnosis not present

## 2020-07-27 LAB — URINALYSIS, COMPLETE (UACMP) WITH MICROSCOPIC
Bilirubin Urine: NEGATIVE
Glucose, UA: NEGATIVE mg/dL
Ketones, ur: NEGATIVE mg/dL
Nitrite: NEGATIVE
Protein, ur: 30 mg/dL — AB
Specific Gravity, Urine: 1.025 (ref 1.005–1.030)
pH: 7 (ref 5.0–8.0)

## 2020-07-27 LAB — CBC
HCT: 38.6 % (ref 36.0–46.0)
Hemoglobin: 13.1 g/dL (ref 12.0–15.0)
MCH: 29 pg (ref 26.0–34.0)
MCHC: 33.9 g/dL (ref 30.0–36.0)
MCV: 85.4 fL (ref 80.0–100.0)
Platelets: 333 10*3/uL (ref 150–400)
RBC: 4.52 MIL/uL (ref 3.87–5.11)
RDW: 12.9 % (ref 11.5–15.5)
WBC: 18.5 10*3/uL — ABNORMAL HIGH (ref 4.0–10.5)
nRBC: 0 % (ref 0.0–0.2)

## 2020-07-27 LAB — LIPASE, BLOOD: Lipase: 50 U/L (ref 11–51)

## 2020-07-27 LAB — WET PREP, GENITAL
Sperm: NONE SEEN
Trich, Wet Prep: NONE SEEN
Yeast Wet Prep HPF POC: NONE SEEN

## 2020-07-27 LAB — CHLAMYDIA/NGC RT PCR (ARMC ONLY)
Chlamydia Tr: NOT DETECTED
N gonorrhoeae: NOT DETECTED

## 2020-07-27 LAB — COMPREHENSIVE METABOLIC PANEL
ALT: 19 U/L (ref 0–44)
AST: 23 U/L (ref 15–41)
Albumin: 4.4 g/dL (ref 3.5–5.0)
Alkaline Phosphatase: 49 U/L (ref 38–126)
Anion gap: 9 (ref 5–15)
BUN: 15 mg/dL (ref 6–20)
CO2: 25 mmol/L (ref 22–32)
Calcium: 9.3 mg/dL (ref 8.9–10.3)
Chloride: 104 mmol/L (ref 98–111)
Creatinine, Ser: 0.79 mg/dL (ref 0.44–1.00)
GFR calc Af Amer: >60 mL/min (ref >60)
GFR calc non Af Amer: >60 mL/min (ref >60)
Glucose, Bld: 106 mg/dL — ABNORMAL HIGH (ref 70–99)
Potassium: 4.1 mmol/L (ref 3.5–5.1)
Sodium: 138 mmol/L (ref 135–145)
Total Bilirubin: 2.4 mg/dL — ABNORMAL HIGH (ref 0.3–1.2)
Total Protein: 7.2 g/dL (ref 6.5–8.1)

## 2020-07-27 LAB — POCT PREGNANCY, URINE: Preg Test, Ur: NEGATIVE

## 2020-07-27 MED ORDER — DOXYCYCLINE HYCLATE 100 MG PO CAPS: 100.0000 mg | ORAL_CAPSULE | Freq: Two times a day (BID) | ORAL | 0 refills | Status: AC

## 2020-07-27 MED ORDER — HYDROCODONE-ACETAMINOPHEN 5-325 MG PO TABS
1.0000 | ORAL_TABLET | Freq: Four times a day (QID) | ORAL | 0 refills | Status: AC | PRN
Start: 2020-07-27 — End: 2020-07-30

## 2020-07-27 MED ORDER — LIDOCAINE HCL (PF) 1 % IJ SOLN
INTRAMUSCULAR | Status: AC
  Administered 2020-07-27: 2.1 mL
  Filled 2020-07-27: qty 5

## 2020-07-27 MED ORDER — METRONIDAZOLE 500 MG PO TABS: 500.0000 mg | ORAL_TABLET | Freq: Two times a day (BID) | ORAL | 0 refills | Status: AC

## 2020-07-27 MED ORDER — ONDANSETRON HCL 4 MG/2ML IJ SOLN
4.0000 mg | Freq: Once | INTRAMUSCULAR | Status: AC
  Administered 2020-07-27: 4 mg via INTRAVENOUS
  Filled 2020-07-27: qty 2

## 2020-07-27 MED ORDER — IOHEXOL 300 MG/ML  SOLN
100.0000 mL | Freq: Once | INTRAMUSCULAR | Status: AC | PRN
  Administered 2020-07-27: 100 mL via INTRAVENOUS
  Filled 2020-07-27: qty 100

## 2020-07-27 MED ORDER — CEFTRIAXONE SODIUM 1 G IJ SOLR
500.0000 mg | Freq: Once | INTRAMUSCULAR | Status: AC
  Administered 2020-07-27: 500 mg via INTRAMUSCULAR
  Filled 2020-07-27: qty 10

## 2020-07-27 MED ORDER — ONDANSETRON HCL 4 MG PO TABS: 4.0000 mg | ORAL_TABLET | Freq: Three times a day (TID) | ORAL | 0 refills | Status: DC | PRN

## 2020-07-27 MED ORDER — KETOROLAC TROMETHAMINE 30 MG/ML IJ SOLN
30.0000 mg | Freq: Once | INTRAMUSCULAR | Status: AC
  Administered 2020-07-27: 30 mg via INTRAVENOUS
  Filled 2020-07-27: qty 1

## 2020-07-27 MED ORDER — MORPHINE SULFATE (PF) 4 MG/ML IV SOLN
4.0000 mg | Freq: Once | INTRAVENOUS | Status: AC
  Administered 2020-07-27: 4 mg via INTRAVENOUS
  Filled 2020-07-27: qty 1

## 2020-07-27 MED ORDER — LACTATED RINGERS IV BOLUS
1000.0000 mL | Freq: Once | INTRAVENOUS | Status: AC
  Administered 2020-07-27: 1000 mL via INTRAVENOUS

## 2020-07-27 NOTE — ED Triage Notes (Signed)
Patient presents to the ED with severe mid abdominal pain that started Friday.  Patient was seen in the ED yesterday and discharged.  Patient states she felt better when she woke up this morning, but after drinking Gatorade pain became severe.  Patient states she has vomited x 4 in the past 24 hours and states twice her emesis was tinged with bright red blood.  Patient appears uncomfortable in triage.

## 2020-07-27 NOTE — ED Notes (Addendum)
Pt presents to the ED for generalized abdominal pain and nausea for three days. States that she was see yesterday and that an [place] was done which was negative. Pt states she cannot keep anything down. Pt is A&Ox4 and NAD. VSS. No hx of abdominal surgeries.

## 2020-07-27 NOTE — ED Provider Notes (Signed)
[facility] Emergency Department Provider Note  ____________________________________________   First MD Initiated Contact with Patient 07/27/20 1530     (approximate)  I have reviewed the triage vital signs and the nursing notes.   HISTORY  Chief Complaint Emesis and Abdominal Pain   HPI Taylor Miller is a 24 y.o. female without significant past medical history presents again for assessment of generalized abdominal pain associate with some nonbloody nonbilious emesis and nausea.  Patient states it has been getting progressively worse over the last 3 days.  She was seen yesterday and received some IV fluids as well as morphine and Zofran and felt a little better but feels she is much worse today.  She denies any measured fevers but does endorse some chills.  Denies any headache, earache, sore throat, chest pain, cough, shortness of breath, back pain, burning with urination, urinary frequency, vaginal bleeding or discharge, extremity pain, rash, or other acute complaints.  No personal episodes.  No clear alleviating aggravating factors.  Denies tobacco abuse, EtOH use, illicit drug use.         Past Medical History:  Diagnosis Date  . Medical history non-contributory     Patient Active Problem List   Diagnosis Date Noted  . Missed abortion 05/16/2016    Past Surgical History:  Procedure Laterality Date  . DILATION AND CURETTAGE OF UTERUS N/A 05/18/2016   Procedure: SUCTION DILATATION AND CURETTAGE;  Surgeon: Tilda Burrow, MD;  Location: [facility];  Service: Gynecology;  Laterality: N/A;  . NO PAST SURGERIES      Prior to Admission medications   Medication Sig Start Date End Date Taking? Authorizing Provider  doxycycline (VIBRAMYCIN) 100 MG capsule Take 1 capsule (100 mg total) by mouth 2 (two) times daily for 14 days. 07/27/20 08/10/20  Gilles Chiquito, MD  HYDROcodone-acetaminophen (NORCO) 5-325 MG tablet Take 1 tablet by mouth every 6 (six) hours  as needed for up to 3 days for severe pain. 07/27/20 07/30/20  Gilles Chiquito, MD  metroNIDAZOLE (FLAGYL) 500 MG tablet Take 1 tablet (500 mg total) by mouth 2 (two) times daily for 7 days. 07/27/20 08/03/20  Gilles Chiquito, MD  ondansetron (ZOFRAN) 4 MG tablet Take 1 tablet (4 mg total) by mouth every 8 (eight) hours as needed for up to 10 doses for nausea or vomiting. 07/27/20   Gilles Chiquito, MD  metoCLOPramide (REGLAN) 10 MG tablet Take 1 tablet (10 mg total) by mouth every 6 (six) hours as needed for nausea (nausea/headache). 08/27/17 07/26/20  Gilda Crease, MD  promethazine (PHENERGAN) 25 MG suppository Place 1 suppository (25 mg total) rectally every 6 (six) hours as needed for nausea or vomiting. 08/27/17 07/26/20  Gilda Crease, MD    Allergies Aspirin and Penicillins  No family history on file.  Social History Social History   Tobacco Use  . Smoking status: Former Smoker    Quit date: 05/17/2015    Years since quitting: 5.2  . Smokeless tobacco: Never Used  Substance Use Topics  . Alcohol use: No  . Drug use: No    Review of Systems  Review of Systems  Constitutional: Positive for chills and malaise/fatigue. Negative for fever.  HENT: Negative for sore throat.   Eyes: Negative for pain.  Respiratory: Negative for cough and stridor.   Cardiovascular: Negative for chest pain.  Gastrointestinal: Positive for abdominal pain, nausea and vomiting. Negative for diarrhea.  Genitourinary: Negative for dysuria.  Musculoskeletal: Negative for  myalgias.  Skin: Negative for rash.  Neurological: Negative for seizures, loss of consciousness and headaches.  Psychiatric/Behavioral: Negative for suicidal ideas.  All other systems reviewed and are negative.     ____________________________________________   PHYSICAL EXAM:  VITAL SIGNS: ED Triage Vitals  Enc Vitals Group     BP 07/27/20 1113 115/67     Pulse Rate 07/27/20 1113 63     Resp 07/27/20 1113 16       Temp 07/27/20 1113 98.6 F (37 C)     Temp Source 07/27/20 1113 Oral     SpO2 07/27/20 1113 100 %     Weight 07/27/20 1114 133 lb (60.3 kg)     Height 07/27/20 1114 5\' 2"  (1.575 m)     Head Circumference --      Peak Flow --      Pain Score 07/27/20 1114 10     Pain Loc --      Pain Edu? --      Excl. in GC? --    Vitals:   07/27/20 1113  BP: 115/67  Pulse: 63  Resp: 16  Temp: 98.6 F (37 C)  SpO2: 100%   Physical Exam Vitals and nursing note reviewed. Exam conducted with a chaperone present.  Constitutional:      Appearance: She is well-developed. She is ill-appearing.  HENT:     Head: Normocephalic and atraumatic.     Right Ear: External ear normal.     Left Ear: External ear normal.     Nose: Nose normal.     Mouth/Throat:     Mouth: Mucous membranes are moist.  Eyes:     Conjunctiva/sclera: Conjunctivae normal.  Cardiovascular:     Rate and Rhythm: Normal rate and regular rhythm.     Pulses: Normal pulses.     Heart sounds: No murmur heard.   Pulmonary:     Effort: Pulmonary effort is normal. No respiratory distress.     Breath sounds: Normal breath sounds.  Abdominal:     Palpations: Abdomen is soft.     Tenderness: There is generalized abdominal tenderness and tenderness in the right upper quadrant and left lower quadrant. There is no right CVA tenderness or left CVA tenderness.  Genitourinary:    Cervix: Cervical motion tenderness, friability and erythema present.     Adnexa:        Right: No mass or tenderness.         Left: No mass or tenderness.    Musculoskeletal:     Cervical back: Neck supple.  Skin:    General: Skin is warm and dry.     Capillary Refill: Capillary refill takes less than 2 seconds.  Neurological:     Mental Status: She is alert and oriented to person, place, and time.  Psychiatric:        Mood and Affect: Mood normal.      ____________________________________________   LABS (all labs ordered are listed, but only  abnormal results are displayed)  Labs Reviewed  WET PREP, GENITAL - Abnormal; Notable for the following components:      Result Value   Clue Cells Wet Prep HPF POC PRESENT (*)    WBC, Wet Prep HPF POC MANY (*)    All other components within normal limits  COMPREHENSIVE METABOLIC PANEL - Abnormal; Notable for the following components:   Glucose, Bld 106 (*)    Total Bilirubin 2.4 (*)    All other components within normal limits  CBC - Abnormal; Notable for the following components:   WBC 18.5 (*)    All other components within normal limits  URINALYSIS, COMPLETE (UACMP) WITH MICROSCOPIC - Abnormal; Notable for the following components:   Color, Urine YELLOW (*)    APPearance CLOUDY (*)    Hgb urine dipstick SMALL (*)    Protein, ur 30 (*)    Leukocytes,Ua LARGE (*)    Bacteria, UA FEW (*)    All other components within normal limits  URINE CULTURE  CHLAMYDIA/NGC RT PCR ([facility] ONLY)  LIPASE, BLOOD  HIV ANTIBODY (ROUTINE TESTING W REFLEX)  POC URINE PREG, ED  POCT PREGNANCY, URINE   ____________________________________________  ____________________________________________  RADIOLOGY  Official radiology report(s): CT ABDOMEN PELVIS W CONTRAST  Result Date: 07/27/2020 CLINICAL DATA:  Abdominal pain which began Friday EXAM: CT ABDOMEN AND PELVIS WITH CONTRAST TECHNIQUE: Multidetector CT imaging of the abdomen and pelvis was performed using the standard protocol following bolus administration of intravenous contrast. CONTRAST:  OMNIPAQUE IOHEXOL 300 MG/ML  SOLN COMPARISON:  CT 01/25/2017 FINDINGS: Lower chest: Lung bases are clear. Normal heart size. No pericardial effusion. Hepatobiliary: No worrisome focal liver lesions. Smooth liver surface contour. Normal hepatic attenuation. Normal gallbladder and biliary tree. Pancreas: Unremarkable. No pancreatic ductal dilatation or surrounding inflammatory changes. Spleen: Normal in size. No concerning splenic lesions.  Adrenals/Urinary Tract: Normal adrenal glands. Kidneys are normally located with symmetric enhancement. No suspicious renal lesion, urolithiasis or hydronephrosis. Physiologic distension of the urinary bladder mild bladder wall thickening is nonspecific though possibly reactive to the adjacent process seen in the pelvis. Stomach/Bowel: Distal esophagus, stomach and duodenal sweep are unremarkable. No small bowel wall thickening or dilatation. No evidence of obstruction. A normal appendix is visualized. No colonic dilatation or wall thickening. Vascular/Lymphatic: No significant vascular findings are present. No enlarged abdominal or pelvic lymph nodes. Reproductive: Retroflexed uterus. Myometrial enhancement pattern may be related to the phase of contrast timing. More focal hypoattenuation and loss of fat plane definition noted towards the lower uterine segment. Small cystic appearing focus at the level of the cervix measuring 1.3 cm, could reflect a nabothian cyst though some adjacent stranding and fluid in the pelvis is more nonspecific and could suggest either super infection, inflammation or PID. Normal follicles are present in both ovaries peripherally enhancing follicle in the left ovary likely reflecting a corpus luteum. Other: Small volume of fluid in the pelvis with some surrounding possibly inflammatory stranding. No free air. No bowel containing hernias. Musculoskeletal: No acute bony abnormality. Specifically, no fracture, subluxation, or dislocation. IMPRESSION: 1. Hypoattenuation and heterogeneity with loss of fat plane definition towards the lower uterine segment. Small cystic appearing focus at the level of the cervix measuring 1.3 cm, could reflect a nabothian cyst though some adjacent stranding and fluid in the pelvis is more nonspecific and could suggest either superinfection, inflammation, or PID. Correlate with pelvic exam findings. 2. Mild bladder wall thickening is nonspecific though possibly  reactive to the adjacent process seen in the pelvis. Correlate with urinalysis to exclude cystitis. Electronically Signed   By: Kreg Shropshire M.D.   On: 07/27/2020 16:25    ____________________________________________   PROCEDURES  Procedure(s) performed (including Critical Care):  Procedures   ____________________________________________   INITIAL IMPRESSION / ASSESSMENT AND PLAN / ED COURSE        Patient presents with [place] to history exam for assessment of worsening abdominal pain in the setting of nonbloody nonbilious emesis.  Patient is afebrile hemodynamic stable arrival.  Exam as above.  Work-up from yesterday's ED visit was reviewed by myself and of note include an ultrasound that showed no evidence of acute cholecystitis.  Patient was noted to have an elevated WBC count at 17.6 yesterday as well as a T bili of 4.4 yesterday.  Today patient's white blood cell count is clearly increased as noted above T bili is down to 2.4.  Remainder of patient's work-up including CT and pelvic exam are concerning for PID.  No evidence of intra-abdominal abscess including TOA and has no evidence of appendicitis, diverticulitis, pyelonephritis, acute pancreatitis, or other acute intra-abdominal pathology.  Patient was treated with below noted analgesia.  She was given IM Rocephin in the ED and Rx was written for Doxy and Flagyl as patient did have clue cells on her wet prep suggestive of BV.  Instructed patient that her GC chlamydia and HIV testing would not return while she is emergency room.  Should follow-up with her PCP for these results.  Rx written for Zofran and short course of Norco.  Patient discharged stable condition.  Strict return precautions advised and discussed.  ____________________________________________   FINAL CLINICAL IMPRESSION(S) / ED DIAGNOSES  Final diagnoses:  PID (acute pelvic inflammatory disease)  Bacterial vaginosis    Medications  cefTRIAXone (ROCEPHIN) injection  500 mg (has no administration in time range)  lactated ringers bolus 1,000 mL (1,000 mLs Intravenous New Bag/Given 07/27/20 1548)  ondansetron (ZOFRAN) injection 4 mg (4 mg Intravenous Given 07/27/20 1548)  morphine 4 MG/ML injection 4 mg (4 mg Intravenous Given 07/27/20 1549)  iohexol (OMNIPAQUE) 300 MG/ML solution 100 mL (100 mLs Intravenous Contrast Given 07/27/20 1607)  ketorolac (TORADOL) 30 MG/ML injection 30 mg (30 mg Intravenous Given 07/27/20 1734)  lidocaine (PF) (XYLOCAINE) 1 % injection (2.1 mLs  Given 07/27/20 1736)     ED Discharge Orders         Ordered    doxycycline (VIBRAMYCIN) 100 MG capsule  2 times daily        07/27/20 1656    ondansetron (ZOFRAN) 4 MG tablet  Every 8 hours PRN        07/27/20 1705    HYDROcodone-acetaminophen (NORCO) 5-325 MG tablet  Every 6 hours PRN        07/27/20 1706    metroNIDAZOLE (FLAGYL) 500 MG tablet  2 times daily        07/27/20 1735           Note:  This document was prepared using Dragon voice recognition software and may include unintentional dictation errors.   Gilles Chiquito, MD 07/27/20 [id number]

## 2020-07-29 LAB — URINE CULTURE: Culture: >=100000 — AB

## 2020-09-07 ENCOUNTER — Emergency Department: Payer: BC Managed Care – PPO

## 2020-09-07 ENCOUNTER — Emergency Department
Admission: EM | Admit: 2020-09-07 | Discharge: 2020-09-07 | Disposition: A | Discharge status: Home / Self Care | Payer: BC Managed Care – PPO | Attending: Emergency Medicine | Admitting: Emergency Medicine

## 2020-09-07 ENCOUNTER — Encounter: Payer: Self-pay | Admitting: Emergency Medicine

## 2020-09-07 ENCOUNTER — Other Ambulatory Visit: Payer: Self-pay

## 2020-09-07 DIAGNOSIS — N3000 Acute cystitis without hematuria: Secondary | ICD-10-CM | POA: Insufficient documentation

## 2020-09-07 DIAGNOSIS — E860 Dehydration: Secondary | ICD-10-CM

## 2020-09-07 DIAGNOSIS — Z880 Allergy status to penicillin: Secondary | ICD-10-CM | POA: Insufficient documentation

## 2020-09-07 DIAGNOSIS — Z87891 Personal history of nicotine dependence: Secondary | ICD-10-CM | POA: Diagnosis not present

## 2020-09-07 DIAGNOSIS — R1030 Lower abdominal pain, unspecified: Secondary | ICD-10-CM

## 2020-09-07 DIAGNOSIS — R112 Nausea with vomiting, unspecified: Secondary | ICD-10-CM | POA: Diagnosis not present

## 2020-09-07 DIAGNOSIS — R109 Unspecified abdominal pain: Secondary | ICD-10-CM | POA: Insufficient documentation

## 2020-09-07 LAB — COMPREHENSIVE METABOLIC PANEL
ALT: 23 U/L (ref 0–44)
AST: 19 U/L (ref 15–41)
Albumin: 4.7 g/dL (ref 3.5–5.0)
Alkaline Phosphatase: 47 U/L (ref 38–126)
Anion gap: 11 (ref 5–15)
BUN: 13 mg/dL (ref 6–20)
CO2: 24 mmol/L (ref 22–32)
Calcium: 9.3 mg/dL (ref 8.9–10.3)
Chloride: 101 mmol/L (ref 98–111)
Creatinine, Ser: 0.79 mg/dL (ref 0.44–1.00)
GFR, Estimated: >60 mL/min (ref >60)
Glucose, Bld: 106 mg/dL — ABNORMAL HIGH (ref 70–99)
Potassium: 4.1 mmol/L (ref 3.5–5.1)
Sodium: 136 mmol/L (ref 135–145)
Total Bilirubin: 2.2 mg/dL — ABNORMAL HIGH (ref 0.3–1.2)
Total Protein: 7.9 g/dL (ref 6.5–8.1)

## 2020-09-07 LAB — URINALYSIS, COMPLETE (UACMP) WITH MICROSCOPIC
Bilirubin Urine: NEGATIVE
Glucose, UA: NEGATIVE mg/dL
Ketones, ur: 20 mg/dL — AB
Nitrite: NEGATIVE
Protein, ur: 30 mg/dL — AB
Specific Gravity, Urine: 1.029 (ref 1.005–1.030)
pH: 6 (ref 5.0–8.0)

## 2020-09-07 LAB — CBC
HCT: 39.5 % (ref 36.0–46.0)
Hemoglobin: 13.2 g/dL (ref 12.0–15.0)
MCH: 28.9 pg (ref 26.0–34.0)
MCHC: 33.4 g/dL (ref 30.0–36.0)
MCV: 86.6 fL (ref 80.0–100.0)
Platelets: 360 10*3/uL (ref 150–400)
RBC: 4.56 MIL/uL (ref 3.87–5.11)
RDW: 12.8 % (ref 11.5–15.5)
WBC: 15.4 10*3/uL — ABNORMAL HIGH (ref 4.0–10.5)
nRBC: 0 % (ref 0.0–0.2)

## 2020-09-07 LAB — POC URINE PREG, ED: Preg Test, Ur: NEGATIVE

## 2020-09-07 LAB — LIPASE, BLOOD: Lipase: 124 U/L — ABNORMAL HIGH (ref 11–51)

## 2020-09-07 MED ORDER — IOHEXOL 300 MG/ML  SOLN
100.0000 mL | Freq: Once | INTRAMUSCULAR | Status: AC | PRN
  Administered 2020-09-07: 100 mL via INTRAVENOUS

## 2020-09-07 MED ORDER — LACTATED RINGERS IV BOLUS
2000.0000 mL | Freq: Once | INTRAVENOUS | Status: AC
  Administered 2020-09-07: 2000 mL via INTRAVENOUS

## 2020-09-07 MED ORDER — METRONIDAZOLE 500 MG PO TABS: 500.0000 mg | ORAL_TABLET | Freq: Two times a day (BID) | ORAL | 0 refills | Status: AC

## 2020-09-07 MED ORDER — DOXYCYCLINE HYCLATE 100 MG PO CAPS: 100.0000 mg | ORAL_CAPSULE | Freq: Two times a day (BID) | ORAL | 0 refills | Status: AC

## 2020-09-07 MED ORDER — ONDANSETRON 4 MG PO TBDP: 4.0000 mg | ORAL_TABLET | Freq: Three times a day (TID) | ORAL | 0 refills | Status: DC | PRN

## 2020-09-07 MED ORDER — SODIUM CHLORIDE 0.9 % IV SOLN
2.0000 g | Freq: Once | INTRAVENOUS | Status: AC
  Administered 2020-09-07: 2 g via INTRAVENOUS
  Filled 2020-09-07: qty 20

## 2020-09-07 MED ORDER — PROMETHAZINE HCL 25 MG RE SUPP: 25.0000 mg | Freq: Three times a day (TID) | RECTAL | 0 refills | Status: DC | PRN

## 2020-09-07 MED ORDER — PROMETHAZINE HCL 25 MG/ML IJ SOLN: 12.5000 mg | Freq: Once | INTRAMUSCULAR | Status: DC

## 2020-09-07 MED ORDER — KETOROLAC TROMETHAMINE 30 MG/ML IJ SOLN
15.0000 mg | Freq: Once | INTRAMUSCULAR | Status: AC
  Administered 2020-09-07: 15 mg via INTRAVENOUS
  Filled 2020-09-07: qty 1

## 2020-09-07 MED ORDER — MORPHINE SULFATE (PF) 4 MG/ML IV SOLN
4.0000 mg | Freq: Once | INTRAVENOUS | Status: AC
  Administered 2020-09-07: 4 mg via INTRAVENOUS
  Filled 2020-09-07: qty 1

## 2020-09-07 MED ORDER — ONDANSETRON HCL 4 MG/2ML IJ SOLN
4.0000 mg | Freq: Once | INTRAMUSCULAR | Status: AC
  Administered 2020-09-07: 4 mg via INTRAVENOUS
  Filled 2020-09-07: qty 2

## 2020-09-07 NOTE — ED Triage Notes (Signed)
Pt presents to ED via POV with c/o N/V. Pt states has been "nonstop vomiting" since approx 0500 this morning. Pt states is unable to tolerate PO at this time.   Pt also c/o lower abdominal pain at this time, states feels like a "tightening" feeling.

## 2020-09-07 NOTE — ED Provider Notes (Signed)
[facility] Emergency Department Provider Note  ____________________________________________   First MD Initiated Contact with Patient 09/07/20 1924     (approximate)  I have reviewed the triage vital signs and the nursing notes.   HISTORY  Chief Complaint Emesis    HPI Taylor Miller is a 24 y.o. female  Here with nausea, vomiting. Pt states that ever since a good friend of hers was killed in a moped accident weeks ago, she has had nausea, difficulty sleeping, and intermittent abd cramping. She states it began fairly acutely and seems worse when she is stressed or thinking about it. She's had difficulty eating/drinking for the last several days, and sx seem worse w/ attempts at eating. No specific alleviating factors. No new medications or supplements. No known COVID exposures.        Past Medical History:  Diagnosis Date  . Medical history non-contributory     Patient Active Problem List   Diagnosis Date Noted  . Missed abortion 05/16/2016    Past Surgical History:  Procedure Laterality Date  . DILATION AND CURETTAGE OF UTERUS N/A 05/18/2016   Procedure: SUCTION DILATATION AND CURETTAGE;  Surgeon: Tilda BurrowJohn V Ferguson, MD;  Location: [facility];  Service: Gynecology;  Laterality: N/A;  . NO PAST SURGERIES      Prior to Admission medications   Medication Sig Start Date End Date Taking? Authorizing Provider  doxycycline (VIBRAMYCIN) 100 MG capsule Take 1 capsule (100 mg total) by mouth 2 (two) times daily for 14 days. 09/07/20 09/21/20  Shaune PollackIsaacs, Ferrell Claiborne, MD  metroNIDAZOLE (FLAGYL) 500 MG tablet Take 1 tablet (500 mg total) by mouth 2 (two) times daily for 14 days. 09/07/20 09/21/20  Shaune PollackIsaacs, Oneill Bais, MD  ondansetron (ZOFRAN ODT) 4 MG disintegrating tablet Take 1 tablet (4 mg total) by mouth every 8 (eight) hours as needed for nausea or vomiting. 09/07/20   Shaune PollackIsaacs, Cormac Wint, MD  promethazine (PHENERGAN) 25 MG suppository Place 1 suppository (25 mg total)  rectally every 8 (eight) hours as needed for refractory nausea / vomiting. 09/07/20 09/07/21  Shaune PollackIsaacs, Amenda Duclos, MD  metoCLOPramide (REGLAN) 10 MG tablet Take 1 tablet (10 mg total) by mouth every 6 (six) hours as needed for nausea (nausea/headache). 08/27/17 07/26/20  Gilda CreasePollina, Christopher J, MD    Allergies Aspirin and Penicillins  History reviewed. No pertinent family history.  Social History Social History   Tobacco Use  . Smoking status: Former Smoker    Quit date: 05/17/2015    Years since quitting: 5.3  . Smokeless tobacco: Never Used  Substance Use Topics  . Alcohol use: No  . Drug use: No    Review of Systems  Review of Systems  Constitutional: Positive for fatigue. Negative for fever.  HENT: Negative for congestion and sore throat.   Eyes: Negative for visual disturbance.  Respiratory: Negative for cough and shortness of breath.   Cardiovascular: Negative for chest pain.  Gastrointestinal: Positive for abdominal pain, nausea and vomiting. Negative for diarrhea.  Genitourinary: Negative for flank pain.  Musculoskeletal: Negative for back pain and neck pain.  Skin: Negative for rash and wound.  Neurological: Positive for weakness and light-headedness.  All other systems reviewed and are negative.    ____________________________________________  PHYSICAL EXAM:      VITAL SIGNS: ED Triage Vitals  Enc Vitals Group     BP 09/07/20 1544 (!) 114/52     Pulse Rate 09/07/20 1544 (!) 59     Resp 09/07/20 1544 18     Temp  09/07/20 1544 98.8 F (37.1 C)     Temp Source 09/07/20 1544 Oral     SpO2 09/07/20 1544 98 %     Weight 09/07/20 1545 126 lb (57.2 kg)     Height 09/07/20 1545 5\' 2"  (1.575 m)     Head Circumference --      Peak Flow --      Pain Score 09/07/20 1545 6     Pain Loc --      Pain Edu? --      Excl. in GC? --      Physical Exam Vitals and nursing note reviewed.  Constitutional:      General: She is not in acute distress.    Appearance: She is  well-developed.  HENT:     Head: Normocephalic and atraumatic.     Mouth/Throat:     Mouth: Mucous membranes are dry.  Eyes:     Conjunctiva/sclera: Conjunctivae normal.  Cardiovascular:     Rate and Rhythm: Normal rate and regular rhythm.     Heart sounds: Normal heart sounds. No murmur heard.  No friction rub.  Pulmonary:     Effort: Pulmonary effort is normal. No respiratory distress.     Breath sounds: Normal breath sounds. No wheezing or rales.  Abdominal:     General: Abdomen is flat. There is no distension.     Palpations: Abdomen is soft.     Tenderness: There is no abdominal tenderness. There is no guarding or rebound.  Musculoskeletal:     Cervical back: Neck supple.  Skin:    General: Skin is warm.     Capillary Refill: Capillary refill takes less than 2 seconds.  Neurological:     Mental Status: She is alert and oriented to person, place, and time.     Motor: No abnormal muscle tone.       ____________________________________________   LABS (all labs ordered are listed, but only abnormal results are displayed)  Labs Reviewed  LIPASE, BLOOD - Abnormal; Notable for the following components:      Result Value   Lipase 124 (*)    All other components within normal limits  COMPREHENSIVE METABOLIC PANEL - Abnormal; Notable for the following components:   Glucose, Bld 106 (*)    Total Bilirubin 2.2 (*)    All other components within normal limits  CBC - Abnormal; Notable for the following components:   WBC 15.4 (*)    All other components within normal limits  URINALYSIS, COMPLETE (UACMP) WITH MICROSCOPIC - Abnormal; Notable for the following components:   Color, Urine YELLOW (*)    APPearance CLOUDY (*)    Hgb urine dipstick SMALL (*)    Ketones, ur 20 (*)    Protein, ur 30 (*)    Leukocytes,Ua SMALL (*)    Bacteria, UA RARE (*)    All other components within normal limits  URINE CULTURE  CHLAMYDIA/NGC RT PCR ([facility] ONLY)  POC URINE PREG, ED     ____________________________________________  EKG:  ________________________________________  RADIOLOGY All imaging, including plain films, CT scans, and ultrasounds, independently reviewed by me, and interpretations confirmed via formal radiology reads.  ED MD interpretation:   CT A/P: Small R adnexal cyst, likely ovarian U/S: Multiple anechoic follicles, no torsion  Official radiology report(s): CT ABDOMEN PELVIS W CONTRAST  Result Date: 09/07/2020 CLINICAL DATA:  Lower abdominal pain and vomiting. EXAM: CT ABDOMEN AND PELVIS WITH CONTRAST TECHNIQUE: Multidetector CT imaging of the abdomen and pelvis was performed  using the standard protocol following bolus administration of intravenous contrast. CONTRAST:  OMNIPAQUE IOHEXOL 300 MG/ML  SOLN COMPARISON:  July 27, 2020 FINDINGS: Lower chest: No acute abnormality. Hepatobiliary: No focal liver abnormality is seen. No gallstones, gallbladder wall thickening, or biliary dilatation. Pancreas: Unremarkable. No pancreatic ductal dilatation or surrounding inflammatory changes. Spleen: Normal in size without focal abnormality. Adrenals/Urinary Tract: Adrenal glands are unremarkable. Kidneys are normal, without renal calculi, focal lesion, or hydronephrosis. Bladder is unremarkable. Stomach/Bowel: Stomach is within normal limits. Appendix appears normal. No evidence of bowel wall thickening, distention, or inflammatory changes. Vascular/Lymphatic: No significant vascular findings are present. No enlarged abdominal or pelvic lymph nodes. Reproductive: Uterus is unremarkable. A 1.0 cm x 0.7 cm cyst is seen along the anterior aspect of the right adnexa. A 0.7 cm diameter tubular appearing area of fluid attenuation is seen along the posteromedial aspect of the right adnexa (axial CT images 67 through 70, CT series number 2/coronal reformatted images 48 through 53, CT series number 5). Other: No abdominal wall hernia or abnormality. A very small  amount of posterior pelvic fluid is seen. Musculoskeletal: No acute or significant osseous findings. IMPRESSION: 1. Small right adnexal cyst, likely ovarian in origin. 2. 0.7 cm diameter tubular appearing area of fluid attenuation along the posteromedial aspect of the right adnexa which may represent a small hydrosalpinx. Correlation with pelvic ultrasound is recommended. Electronically Signed   By: Aram Candela M.D.   On: 09/07/2020 20:25   US PELVIC COMPLETE W TRANSVAGINAL AND TORSION R/O  Result Date: 09/07/2020 CLINICAL DATA:  Lower abdominal pain for 2 days, negative UPT EXAM: TRANSABDOMINAL AND TRANSVAGINAL ULTRASOUND OF PELVIS DOPPLER ULTRASOUND OF OVARIES TECHNIQUE: Both transabdominal and transvaginal ultrasound examinations of the pelvis were performed. Transabdominal technique was performed for global imaging of the pelvis including uterus, ovaries, adnexal regions, and pelvic cul-de-sac. It was necessary to proceed with endovaginal exam following the transabdominal exam to visualize the ovaries. Color and duplex Doppler ultrasound was utilized to evaluate blood flow to the ovaries. COMPARISON:  Obstetrical ultrasound 05/16/2016, CT 09/07/2020 FINDINGS: Uterus Measurements: 6.7 x 4.6 x 5.2 cm = volume: 85.1 mL. Retroverted and retroflexed uterus. No focal myometrial abnormality. No discernible fibroids or other concerning uterine masses. Suspect few anechoic nabothian cysts towards the level of the cervix. Endometrium Thickness: 7.2 mm, non thickened.  No focal abnormality visualized. Right ovary Measurements: 3.7 x 2.1 x 2.3 cm = volume: 9.3 mL. Multiple anechoic simple appearing follicles are seen in the right ovary. Largest measuring 1.4 x 1.2 x 1.2 cm without concerning internal features. No worrisome adnexal lesion or discernible hydrosalpinx. Left ovary Measurements: 2.9 x 1.4 x 1.3 cm = volume: 2.7 mL. Normal follicles. No concerning adnexal lesion. Pulsed Doppler evaluation of both  ovaries demonstrates normal low-resistance arterial and venous waveforms. Other findings Trace to small volume anechoic free fluid in the deep pelvis, nonspecific and possibly physiologic in this reproductive age female. IMPRESSION: Multiple anechoic follicles are seen within the right adnexa. No demonstrable hydrosalpinx or tubo-ovarian abscess is seen. No evidence of ovarian torsion or other acute pelvic abnormality. Trace to small volume anechoic free fluid in the deep pelvis, nonspecific though possibly physiologic in a reproductive age female. Electronically Signed   By: Kreg Shropshire M.D.   On: 09/07/2020 23:01    ____________________________________________  PROCEDURES   Procedure(s) performed (including Critical Care):  Procedures  ____________________________________________  INITIAL IMPRESSION / MDM / ASSESSMENT AND PLAN / ED COURSE  As part of  my medical decision making, I reviewed the following data within the electronic MEDICAL RECORD NUMBER Nursing notes reviewed and incorporated, Old chart reviewed, Notes from prior ED visits, and  Controlled Substance Database       *Taylor Miller was evaluated in Emergency Department on 09/08/2020 for the symptoms described in the history of present illness. She was evaluated in the context of the global COVID-19 pandemic, which necessitated consideration that the patient might be at risk for infection with the SARS-CoV-2 virus that causes COVID-19. Institutional protocols and algorithms that pertain to the evaluation of patients at risk for COVID-19 are in a state of rapid change based on information released by regulatory bodies including the CDC and federal and state organizations. These policies and algorithms were followed during the patient's care in the ED.  Some ED evaluations and interventions may be delayed as a result of limited staffing during the pandemic.*     Medical Decision Making:  24 yo F here with lower abd pain, nausea,  vomiting. Suspect viral GI illness vs food-borne illness vs possible IBS given relation to her friend passing away. Her lower abd pain is likely related but could also represent UTI vs PID. Clinically, pt appears non toxic though mildly dehydrated. UA with ketonuria, mild pyuria. CBC shows likely reactive leukocytosis. Lipase elevated though pt has no epigastric pain, no EtOH use or gallstones, and suspect this si related to her vomiting and dehydration.  Pt given fluids, antiemetics with good effect. Tolerating PO without difficulty. Given her focal TTP and leukocytosis, CT obtained which shows R adnexal cyst vs hydrosalpinx. U/S subsequently obtained shows no torsion, no significant abnormalities.   Based on pt's lower abd pain, pyuria, leukocytosis, and recent episode of PID with sx concerning for recurrence, will tx empirically. Pt updated and aware. Will also given antiemetics and encourage close f/u as outpt.   ____________________________________________  FINAL CLINICAL IMPRESSION(S) / ED DIAGNOSES  Final diagnoses:  Dehydration  Non-intractable vomiting with nausea, unspecified vomiting type  Acute cystitis without hematuria     MEDICATIONS GIVEN DURING THIS VISIT:  Medications  lactated ringers bolus 2,000 mL (0 mLs Intravenous Stopped 09/07/20 2346)  ondansetron (ZOFRAN) injection 4 mg (4 mg Intravenous Given 09/07/20 1939)  morphine 4 MG/ML injection 4 mg (4 mg Intravenous Given 09/07/20 1939)  iohexol (OMNIPAQUE) 300 MG/ML solution 100 mL (100 mLs Intravenous Contrast Given 09/07/20 1958)  ketorolac (TORADOL) 30 MG/ML injection 15 mg (15 mg Intravenous Given 09/07/20 2145)  cefTRIAXone (ROCEPHIN) 2 g in sodium chloride 0.9 % 100 mL IVPB (0 g Intravenous Stopped 09/07/20 2346)     ED Discharge Orders         Ordered    ondansetron (ZOFRAN ODT) 4 MG disintegrating tablet  Every 8 hours PRN        09/07/20 2340    promethazine (PHENERGAN) 25 MG suppository  Every 8 hours PRN         09/07/20 2340    doxycycline (VIBRAMYCIN) 100 MG capsule  2 times daily        09/07/20 2340    metroNIDAZOLE (FLAGYL) 500 MG tablet  2 times daily        09/07/20 2340           Note:  This document was prepared using Dragon voice recognition software and may include unintentional dictation errors.   Shaune Pollack, MD 09/08/20 [phone]

## 2020-09-07 NOTE — Discharge Instructions (Addendum)
As we discussed, your labs and imaging were overall reassuring  Your urine showed possible inflammation, which given your imaging findings, we will treat for possible uterus infection  Take the full course of antibiotic  Take the nausea medications as needed

## 2020-09-07 NOTE — ED Notes (Signed)
2nd bag of IVF started.

## 2020-09-08 ENCOUNTER — Other Ambulatory Visit: Payer: Self-pay

## 2020-09-08 ENCOUNTER — Encounter: Payer: Self-pay | Admitting: Emergency Medicine

## 2020-09-08 ENCOUNTER — Observation Stay
Admission: EM | Admit: 2020-09-08 | Discharge: 2020-09-08 | Disposition: A | Discharge status: Home / Self Care | Payer: BC Managed Care – PPO | Attending: Internal Medicine | Admitting: Internal Medicine

## 2020-09-08 DIAGNOSIS — R112 Nausea with vomiting, unspecified: Principal | ICD-10-CM | POA: Diagnosis present

## 2020-09-08 DIAGNOSIS — Z20822 Contact with and (suspected) exposure to covid-19: Secondary | ICD-10-CM | POA: Diagnosis not present

## 2020-09-08 DIAGNOSIS — N760 Acute vaginitis: Secondary | ICD-10-CM | POA: Diagnosis not present

## 2020-09-08 DIAGNOSIS — R197 Diarrhea, unspecified: Secondary | ICD-10-CM

## 2020-09-08 DIAGNOSIS — R1110 Vomiting, unspecified: Secondary | ICD-10-CM | POA: Insufficient documentation

## 2020-09-08 DIAGNOSIS — N390 Urinary tract infection, site not specified: Secondary | ICD-10-CM | POA: Diagnosis not present

## 2020-09-08 DIAGNOSIS — N3000 Acute cystitis without hematuria: Secondary | ICD-10-CM

## 2020-09-08 DIAGNOSIS — R1032 Left lower quadrant pain: Secondary | ICD-10-CM

## 2020-09-08 DIAGNOSIS — B9689 Other specified bacterial agents as the cause of diseases classified elsewhere: Secondary | ICD-10-CM

## 2020-09-08 DIAGNOSIS — Z87891 Personal history of nicotine dependence: Secondary | ICD-10-CM | POA: Diagnosis not present

## 2020-09-08 DIAGNOSIS — E876 Hypokalemia: Secondary | ICD-10-CM | POA: Diagnosis not present

## 2020-09-08 DIAGNOSIS — R109 Unspecified abdominal pain: Secondary | ICD-10-CM | POA: Diagnosis present

## 2020-09-08 LAB — URINE DRUG SCREEN, QUALITATIVE (ARMC ONLY)
Amphetamines, Ur Screen: NOT DETECTED
Barbiturates, Ur Screen: NOT DETECTED
Benzodiazepine, Ur Scrn: NOT DETECTED
Cannabinoid 50 Ng, Ur ~~LOC~~: POSITIVE — AB
Cocaine Metabolite,Ur ~~LOC~~: NOT DETECTED
MDMA (Ecstasy)Ur Screen: NOT DETECTED
Methadone Scn, Ur: NOT DETECTED
Opiate, Ur Screen: NOT DETECTED
Phencyclidine (PCP) Ur S: NOT DETECTED
Tricyclic, Ur Screen: NOT DETECTED

## 2020-09-08 LAB — CBC
HCT: 37 % (ref 36.0–46.0)
Hemoglobin: 12.7 g/dL (ref 12.0–15.0)
MCH: 29.5 pg (ref 26.0–34.0)
MCHC: 34.3 g/dL (ref 30.0–36.0)
MCV: 86 fL (ref 80.0–100.0)
Platelets: 351 10*3/uL (ref 150–400)
RBC: 4.3 MIL/uL (ref 3.87–5.11)
RDW: 12.8 % (ref 11.5–15.5)
WBC: 15.1 10*3/uL — ABNORMAL HIGH (ref 4.0–10.5)
nRBC: 0 % (ref 0.0–0.2)

## 2020-09-08 LAB — URINALYSIS, COMPLETE (UACMP) WITH MICROSCOPIC
Bacteria, UA: NONE SEEN
Bilirubin Urine: NEGATIVE
Glucose, UA: NEGATIVE mg/dL
Hgb urine dipstick: NEGATIVE
Ketones, ur: 20 mg/dL — AB
Nitrite: NEGATIVE
Protein, ur: 30 mg/dL — AB
Specific Gravity, Urine: 1.029 (ref 1.005–1.030)
pH: 6 (ref 5.0–8.0)

## 2020-09-08 LAB — COMPREHENSIVE METABOLIC PANEL
ALT: 20 U/L (ref 0–44)
AST: 18 U/L (ref 15–41)
Albumin: 4.3 g/dL (ref 3.5–5.0)
Alkaline Phosphatase: 39 U/L (ref 38–126)
Anion gap: 11 (ref 5–15)
BUN: 11 mg/dL (ref 6–20)
CO2: 28 mmol/L (ref 22–32)
Calcium: 9 mg/dL (ref 8.9–10.3)
Chloride: 98 mmol/L (ref 98–111)
Creatinine, Ser: 0.85 mg/dL (ref 0.44–1.00)
GFR, Estimated: >60 mL/min (ref >60)
Glucose, Bld: 96 mg/dL (ref 70–99)
Potassium: 3.2 mmol/L — ABNORMAL LOW (ref 3.5–5.1)
Sodium: 137 mmol/L (ref 135–145)
Total Bilirubin: 2.2 mg/dL — ABNORMAL HIGH (ref 0.3–1.2)
Total Protein: 7 g/dL (ref 6.5–8.1)

## 2020-09-08 LAB — PREGNANCY, URINE: Preg Test, Ur: NEGATIVE

## 2020-09-08 LAB — CHLAMYDIA/NGC RT PCR (ARMC ONLY)
Chlamydia Tr: NOT DETECTED
N gonorrhoeae: NOT DETECTED

## 2020-09-08 LAB — RESPIRATORY PANEL BY RT PCR (FLU A&B, COVID)
Influenza A by PCR: NEGATIVE
Influenza B by PCR: NEGATIVE
SARS Coronavirus 2 by RT PCR: NEGATIVE

## 2020-09-08 LAB — LIPASE, BLOOD: Lipase: 21 U/L (ref 11–51)

## 2020-09-08 LAB — MAGNESIUM: Magnesium: 1.9 mg/dL (ref 1.7–2.4)

## 2020-09-08 MED ORDER — MORPHINE SULFATE (PF) 2 MG/ML IV SOLN: 2.0000 mg | INTRAVENOUS | Status: DC | PRN

## 2020-09-08 MED ORDER — SODIUM CHLORIDE 0.9 % IV SOLN: 1.0000 g | INTRAVENOUS | Status: DC

## 2020-09-08 MED ORDER — HALOPERIDOL LACTATE 5 MG/ML IJ SOLN
2.5000 mg | Freq: Once | INTRAMUSCULAR | Status: AC
  Administered 2020-09-08: 2.5 mg via INTRAVENOUS
  Filled 2020-09-08: qty 1

## 2020-09-08 MED ORDER — ACETAMINOPHEN 325 MG PO TABS: 650.0000 mg | ORAL_TABLET | Freq: Four times a day (QID) | ORAL | Status: DC | PRN

## 2020-09-08 MED ORDER — PROMETHAZINE HCL 25 MG PO TABS: 25.0000 mg | ORAL_TABLET | Freq: Three times a day (TID) | ORAL | 0 refills | Status: DC | PRN

## 2020-09-08 MED ORDER — LACTATED RINGERS IV BOLUS
1000.0000 mL | Freq: Once | INTRAVENOUS | Status: AC
  Administered 2020-09-08: 1000 mL via INTRAVENOUS

## 2020-09-08 MED ORDER — ONDANSETRON HCL 4 MG/2ML IJ SOLN: 4.0000 mg | Freq: Three times a day (TID) | INTRAMUSCULAR | Status: DC | PRN

## 2020-09-08 MED ORDER — SODIUM CHLORIDE 0.9 % IV SOLN: 1.0000 g | Freq: Once | INTRAVENOUS | Status: DC

## 2020-09-08 MED ORDER — ONDANSETRON HCL 4 MG/2ML IJ SOLN: 4.0000 mg | Freq: Once | INTRAMUSCULAR | Status: DC

## 2020-09-08 MED ORDER — LACTATED RINGERS IV SOLN: INTRAVENOUS | Status: DC

## 2020-09-08 MED ORDER — POTASSIUM CHLORIDE CRYS ER 20 MEQ PO TBCR
40.0000 meq | EXTENDED_RELEASE_TABLET | Freq: Once | ORAL | Status: AC
  Administered 2020-09-08: 40 meq via ORAL
  Filled 2020-09-08: qty 2

## 2020-09-08 NOTE — Discharge Summary (Signed)
Physician Discharge Summary  Taylor Miller GXQ:[id number] DOB: September 29, 1996 DOA: 09/08/2020  PCP: [facility]  Admit date: 09/08/2020 Discharge date: 09/08/2020  Recommendations for Outpatient Follow-up:  -none since pt left [facility] AMA  Home Health: None Equipment/Devices: None  Discharge Condition: Stable CODE STATUS: Full code Diet recommendation: Regular diet  Brief/Interim Summary (HPI)  Taylor Miller is a 24 y.o. female without significant past medical history, who presents with nausea, vomiting, diarrhea and abdominal pain.   Patient states that she has been having nausea vomiting, diarrhea for more than 4 days.  She also lower abdominal pain, which is constant, cramping, 6 out of 10 severity, nonradiating.  Denies fever or chills.  Patient stated her diarrhea has resolved today.  She has had vomited more than 10 times since yesterday with nonbilious and nonbloody vomiting.  Denies chest pain, shortness breath, cough.  Denies symptoms of UTI.  No vaginal discharge. Pt was seen in ED yesterday. She had unremarkable CT scan of abdomen/pelvis and pelvic ultrasound yesterday. UA showed possible pyuria.  Chlamydia PCR negative.  Wet prep is positive for clue cells.  Patient was started on doxycycline and Flagyl yesterday without improvement.  ED Course: pt was found to have WBC 15.1, lipase 124 yesterday --> 21 today, urinalysis (hazy appearance, trace amount of leukocyte, negative bacteria, WBC 6-10), pending Covid PCR, potassium 3.2, renal function okay, negative pregnancy test yesterday, temperature normal, blood pressure 117/84, heart rate 60, RR 16, oxygen saturation 99% on room air.  Patient is placed on MedSurg bed for observation.  Subjective  -Nausea, vomiting, diarrhea, lower abdominal pain   Discharge Diagnoses and [facility] Course:   Principal Problem:   UTI (urinary tract infection) Active Problems:   Nausea, vomiting and  diarrhea   Abdominal pain   Hypokalemia   Bacterial vaginosis   UTI (urinary tract infection): Patient has leukocytosis, but no fever. Not septic clinically. Hemodynamically stable. Assessment and plan was made as below, but unfortunately patient left [facility] AMA.  -IV Rocephin -Follow-up blood culture and urine culture  Nausea, vomiting and diarrhea and lower abdominal pain: May be due to viral gastroenteritis. Lower abdominal pain may be due to UTI. Her diarrhea has resolved. Patient had lipase 124 yesterday, indicating possible pancreatitis, but the repeated lipase today is 21. -As needed Zofran and morphine -IV fluid: 1 L LR, then 125 cc/h  Hypokalemia: -Repleted potassium -Check magnesium level  Bacterial vaginosis -Continue Flagyl    Discharge Instructions   Allergies as of 09/08/2020      Reactions   Aspirin Rash   Pt lists as an allergy due to strong family hx.   Penicillins Rash   As a child    Med Rec must be completed prior to using this [facility]      Follow-up Information    Health, [facility] Personal.   Why: As needed Contact information: [address] [place] [address] [phone]              Allergies  Allergen Reactions  . Aspirin Rash    Pt lists as an allergy due to strong family hx.  . Penicillins Rash    As a child     Consultations:  none   Procedures/Studies: CT ABDOMEN PELVIS W CONTRAST  Result Date: 09/07/2020 CLINICAL DATA:  Lower abdominal pain and vomiting. EXAM: CT ABDOMEN AND PELVIS WITH CONTRAST TECHNIQUE: Multidetector CT imaging of the abdomen and pelvis was performed using the standard  protocol following bolus administration of intravenous contrast. CONTRAST:  OMNIPAQUE IOHEXOL 300 MG/ML  SOLN COMPARISON:  July 27, 2020 FINDINGS: Lower chest: No acute abnormality. Hepatobiliary: No focal liver abnormality is seen. No gallstones, gallbladder wall thickening, or biliary  dilatation. Pancreas: Unremarkable. No pancreatic ductal dilatation or surrounding inflammatory changes. Spleen: Normal in size without focal abnormality. Adrenals/Urinary Tract: Adrenal glands are unremarkable. Kidneys are normal, without renal calculi, focal lesion, or hydronephrosis. Bladder is unremarkable. Stomach/Bowel: Stomach is within normal limits. Appendix appears normal. No evidence of bowel wall thickening, distention, or inflammatory changes. Vascular/Lymphatic: No significant vascular findings are present. No enlarged abdominal or pelvic lymph nodes. Reproductive: Uterus is unremarkable. A 1.0 cm x 0.7 cm cyst is seen along the anterior aspect of the right adnexa. A 0.7 cm diameter tubular appearing area of fluid attenuation is seen along the posteromedial aspect of the right adnexa (axial CT images 67 through 70, CT series number 2/coronal reformatted images 48 through 53, CT series number 5). Other: No abdominal wall hernia or abnormality. A very small amount of posterior pelvic fluid is seen. Musculoskeletal: No acute or significant osseous findings. IMPRESSION: 1. Small right adnexal cyst, likely ovarian in origin. 2. 0.7 cm diameter tubular appearing area of fluid attenuation along the posteromedial aspect of the right adnexa which may represent a small hydrosalpinx. Correlation with pelvic ultrasound is recommended. Electronically Signed   By: Aram Candela M.D.   On: 09/07/2020 20:25   US PELVIC COMPLETE W TRANSVAGINAL AND TORSION R/O  Result Date: 09/07/2020 CLINICAL DATA:  Lower abdominal pain for 2 days, negative UPT EXAM: TRANSABDOMINAL AND TRANSVAGINAL ULTRASOUND OF PELVIS DOPPLER ULTRASOUND OF OVARIES TECHNIQUE: Both transabdominal and transvaginal ultrasound examinations of the pelvis were performed. Transabdominal technique was performed for global imaging of the pelvis including uterus, ovaries, adnexal regions, and pelvic cul-de-sac. It was necessary to proceed with  endovaginal exam following the transabdominal exam to visualize the ovaries. Color and duplex Doppler ultrasound was utilized to evaluate blood flow to the ovaries. COMPARISON:  Obstetrical ultrasound 05/16/2016, CT 09/07/2020 FINDINGS: Uterus Measurements: 6.7 x 4.6 x 5.2 cm = volume: 85.1 mL. Retroverted and retroflexed uterus. No focal myometrial abnormality. No discernible fibroids or other concerning uterine masses. Suspect few anechoic nabothian cysts towards the level of the cervix. Endometrium Thickness: 7.2 mm, non thickened.  No focal abnormality visualized. Right ovary Measurements: 3.7 x 2.1 x 2.3 cm = volume: 9.3 mL. Multiple anechoic simple appearing follicles are seen in the right ovary. Largest measuring 1.4 x 1.2 x 1.2 cm without concerning internal features. No worrisome adnexal lesion or discernible hydrosalpinx. Left ovary Measurements: 2.9 x 1.4 x 1.3 cm = volume: 2.7 mL. Normal follicles. No concerning adnexal lesion. Pulsed Doppler evaluation of both ovaries demonstrates normal low-resistance arterial and venous waveforms. Other findings Trace to small volume anechoic free fluid in the deep pelvis, nonspecific and possibly physiologic in this reproductive age female. IMPRESSION: Multiple anechoic follicles are seen within the right adnexa. No demonstrable hydrosalpinx or tubo-ovarian abscess is seen. No evidence of ovarian torsion or other acute pelvic abnormality. Trace to small volume anechoic free fluid in the deep pelvis, nonspecific though possibly physiologic in a reproductive age female. Electronically Signed   By: Kreg Shropshire M.D.   On: 09/07/2020 23:01      Discharge Exam: Vitals:   09/08/20 1325 09/08/20 1647  BP: 117/84 113/61  Pulse: 60 81  Resp: 16 18  Temp: 98.4 F (36.9 C) 98.4 F (36.9 C)  SpO2: 99% 98%   Vitals:   09/08/20 1323 09/08/20 1325 09/08/20 1647  BP:  117/84 113/61  Pulse:  60 81  Resp:  16 18  Temp:  98.4 F (36.9 C) 98.4 F (36.9 C)   TempSrc:  Oral Oral  SpO2:  99% 98%  Weight: 57.2 kg       The results of significant diagnostics from this hospitalization (including imaging, microbiology, ancillary and laboratory) are listed below for reference.     Microbiology: Recent Results (from the past 240 hour(s))  Chlamydia/NGC rt PCR ([facility] only)     Status: None   Collection Time: 09/07/20  3:48 PM   Specimen: Urine, Clean Catch  Result Value Ref Range Status   Specimen source GC/Chlam URINE, RANDOM  Final   Chlamydia Tr NOT DETECTED NOT DETECTED Final   N gonorrhoeae NOT DETECTED NOT DETECTED Final    Comment: (NOTE) This CT/NG assay has not been evaluated in patients with a history of  hysterectomy. Performed at [facility], [address] [place], [place] [address]   Respiratory Panel by RT PCR (Flu A&B, Covid) - Nasopharyngeal Swab     Status: None   Collection Time: 09/08/20  4:09 PM   Specimen: Nasopharyngeal Swab  Result Value Ref Range Status   SARS Coronavirus 2 by RT PCR NEGATIVE NEGATIVE Final    Comment: (NOTE) SARS-CoV-2 target nucleic acids are NOT DETECTED.  The SARS-CoV-2 RNA is generally detectable in upper respiratoy specimens during the acute phase of infection. The lowest concentration of SARS-CoV-2 viral copies this assay can detect is 131 copies/mL. A negative result does not preclude SARS-Cov-2 infection and should not be used as the sole basis for treatment or other patient management decisions. A negative result may occur with  improper specimen collection/handling, submission of specimen other than nasopharyngeal swab, presence of viral mutation(s) within the areas targeted by this assay, and inadequate number of viral copies (<131 copies/mL). A negative result must be combined with clinical observations, patient history, and epidemiological information. The expected result is Negative.  Fact Sheet for Patients:  https://www.moore.com/https://www.fda.gov/media/142436/download  Fact Sheet  for Healthcare Providers:  https://www.young.biz/https://www.fda.gov/media/142435/download  This test is no t yet approved or cleared by the [place] FDA and  has been authorized for detection and/or diagnosis of SARS-CoV-2 by FDA under an Emergency Use Authorization (EUA). This EUA will remain  in effect (meaning this test can be used) for the duration of the COVID-19 declaration under Section 564(b)(1) of the Act, 21 U.S.C. section 360bbb-3(b)(1), unless the authorization is terminated or revoked sooner.     Influenza A by PCR NEGATIVE NEGATIVE Final   Influenza B by PCR NEGATIVE NEGATIVE Final    Comment: (NOTE) The Xpert Xpress SARS-CoV-2/FLU/RSV assay is intended as an aid in  the diagnosis of influenza from Nasopharyngeal swab specimens and  should not be used as a sole basis for treatment. Nasal washings and  aspirates are unacceptable for Xpert Xpress SARS-CoV-2/FLU/RSV  testing.  Fact Sheet for Patients: https://www.moore.com/https://www.fda.gov/media/142436/download  Fact Sheet for Healthcare Providers: https://www.young.biz/https://www.fda.gov/media/142435/download  This test is not yet approved or cleared by the [place] FDA and  has been authorized for detection and/or diagnosis of SARS-CoV-2 by  FDA under an Emergency Use Authorization (EUA). This EUA will remain  in effect (meaning this test can be used) for the duration of the  Covid-19 declaration under Section 564(b)(1) of the Act, 21  U.S.C. section 360bbb-3(b)(1), unless the authorization is  terminated or revoked. Performed  at [facility], [address] [place], [place] [address]      Labs: BNP (last 3 results) No results for input(s): BNP in the last 8760 hours. Basic Metabolic Panel: Recent Labs  Lab 09/07/20 1548 09/08/20 1325  NA 136 137  K 4.1 3.2*  CL 101 98  CO2 24 28  GLUCOSE 106* 96  BUN 13 11  CREATININE 0.79 0.85  CALCIUM 9.3 9.0  MG  --  1.9   Liver Function Tests: Recent Labs  Lab 09/07/20 1548 09/08/20 1325  AST  19 18  ALT 23 20  ALKPHOS 47 39  BILITOT 2.2* 2.2*  PROT 7.9 7.0  ALBUMIN 4.7 4.3   Recent Labs  Lab 09/07/20 1548 09/08/20 1325  LIPASE 124* 21   No results for input(s): AMMONIA in the last 168 hours. CBC: Recent Labs  Lab 09/07/20 1548 09/08/20 1325  WBC 15.4* 15.1*  HGB 13.2 12.7  HCT 39.5 37.0  MCV 86.6 86.0  PLT 360 351   Cardiac Enzymes: No results for input(s): CKTOTAL, CKMB, CKMBINDEX, TROPONINI in the last 168 hours. BNP: Invalid input(s): POCBNP CBG: No results for input(s): GLUCAP in the last 168 hours. D-Dimer No results for input(s): DDIMER in the last 72 hours. Hgb A1c No results for input(s): HGBA1C in the last 72 hours. Lipid Profile No results for input(s): CHOL, HDL, LDLCALC, TRIG, CHOLHDL, LDLDIRECT in the last 72 hours. Thyroid function studies No results for input(s): TSH, T4TOTAL, T3FREE, THYROIDAB in the last 72 hours.  Invalid input(s): FREET3 Anemia work up No results for input(s): VITAMINB12, FOLATE, FERRITIN, TIBC, IRON, RETICCTPCT in the last 72 hours. Urinalysis    Component Value Date/Time   COLORURINE YELLOW (A) 09/08/2020 1325   APPEARANCEUR HAZY (A) 09/08/2020 1325   APPEARANCEUR Clear 03/07/2015 1459   LABSPEC 1.029 09/08/2020 1325   LABSPEC 1.012 03/07/2015 1459   PHURINE 6.0 09/08/2020 1325   GLUCOSEU NEGATIVE 09/08/2020 1325   GLUCOSEU Negative 03/07/2015 1459   HGBUR NEGATIVE 09/08/2020 1325   BILIRUBINUR NEGATIVE 09/08/2020 1325   BILIRUBINUR Negative 03/07/2015 1459   KETONESUR 20 (A) 09/08/2020 1325   PROTEINUR 30 (A) 09/08/2020 1325   NITRITE NEGATIVE 09/08/2020 1325   LEUKOCYTESUR TRACE (A) 09/08/2020 1325   LEUKOCYTESUR Negative 03/07/2015 1459   Sepsis Labs Invalid input(s): PROCALCITONIN,  WBC,  LACTICIDVEN Microbiology Recent Results (from the past 240 hour(s))  Chlamydia/NGC rt PCR ([facility] only)     Status: None   Collection Time: 09/07/20  3:48 PM   Specimen: Urine, Clean Catch  Result Value Ref  Range Status   Specimen source GC/Chlam URINE, RANDOM  Final   Chlamydia Tr NOT DETECTED NOT DETECTED Final   N gonorrhoeae NOT DETECTED NOT DETECTED Final    Comment: (NOTE) This CT/NG assay has not been evaluated in patients with a history of  hysterectomy. Performed at [facility], [address] [place], [place] [address]   Respiratory Panel by RT PCR (Flu A&B, Covid) - Nasopharyngeal Swab     Status: None   Collection Time: 09/08/20  4:09 PM   Specimen: Nasopharyngeal Swab  Result Value Ref Range Status   SARS Coronavirus 2 by RT PCR NEGATIVE NEGATIVE Final    Comment: (NOTE) SARS-CoV-2 target nucleic acids are NOT DETECTED.  The SARS-CoV-2 RNA is generally detectable in upper respiratoy specimens during the acute phase of infection. The lowest concentration of SARS-CoV-2 viral copies this assay can detect is 131 copies/mL. A negative result does not preclude  SARS-Cov-2 infection and should not be used as the sole basis for treatment or other patient management decisions. A negative result may occur with  improper specimen collection/handling, submission of specimen other than nasopharyngeal swab, presence of viral mutation(s) within the areas targeted by this assay, and inadequate number of viral copies (<131 copies/mL). A negative result must be combined with clinical observations, patient history, and epidemiological information. The expected result is Negative.  Fact Sheet for Patients:  https://www.moore.com/  Fact Sheet for Healthcare Providers:  https://www.young.biz/  This test is no t yet approved or cleared by the [place] FDA and  has been authorized for detection and/or diagnosis of SARS-CoV-2 by FDA under an Emergency Use Authorization (EUA). This EUA will remain  in effect (meaning this test can be used) for the duration of the COVID-19 declaration under Section 564(b)(1) of the Act, 21 U.S.C. section  360bbb-3(b)(1), unless the authorization is terminated or revoked sooner.     Influenza A by PCR NEGATIVE NEGATIVE Final   Influenza B by PCR NEGATIVE NEGATIVE Final    Comment: (NOTE) The Xpert Xpress SARS-CoV-2/FLU/RSV assay is intended as an aid in  the diagnosis of influenza from Nasopharyngeal swab specimens and  should not be used as a sole basis for treatment. Nasal washings and  aspirates are unacceptable for Xpert Xpress SARS-CoV-2/FLU/RSV  testing.  Fact Sheet for Patients: https://www.moore.com/  Fact Sheet for Healthcare Providers: https://www.young.biz/  This test is not yet approved or cleared by the [place] FDA and  has been authorized for detection and/or diagnosis of SARS-CoV-2 by  FDA under an Emergency Use Authorization (EUA). This EUA will remain  in effect (meaning this test can be used) for the duration of the  Covid-19 declaration under Section 564(b)(1) of the Act, 21  U.S.C. section 360bbb-3(b)(1), unless the authorization is  terminated or revoked. Performed at [facility], [address], [place], [place] [address]     Time coordinating discharge:  25 minutes.   SIGNED:  Lorretta Harp, MD [facility] 09/08/2020, 7:27 PM   If 7PM-7AM, please contact night-coverage [email or web address]

## 2020-09-08 NOTE — H&P (Signed)
History and Physical    Taylor Miller JXB:[id number] DOB: 1996/04/17 DOA: 09/08/2020  Referring MD/NP/PA:   PCP: [facility]   Patient coming from:  The patient is coming from home.  At baseline, pt is independent for most of ADL.        Chief Complaint: Nausea, vomiting, diarrhea, abdominal pain  HPI: Taylor Miller is a 24 y.o. female without significant past medical history, who presents with nausea, vomiting, diarrhea and abdominal pain.   Patient states that she has been having nausea vomiting, diarrhea for more than 4 days.  She also lower abdominal pain, which is constant, cramping, 6 out of 10 severity, nonradiating.  Denies fever or chills.  Patient stated her diarrhea has resolved today.  She has had vomited more than 10 times since yesterday with nonbilious and nonbloody vomiting.  Denies chest pain, shortness breath, cough.  Denies symptoms of UTI.  No vaginal discharge. Pt was seen in ED yesterday. She had unremarkable CT scan of abdomen/pelvis and pelvic ultrasound yesterday. UA showed possible pyuria.  Chlamydia PCR negative.  Wet prep is positive for clue cells.  Patient was started on doxycycline and Flagyl yesterday without improvement.  ED Course: pt was found to have WBC 15.1, lipase 124 yesterday --> 21 today, urinalysis (hazy appearance, trace amount of leukocyte, negative bacteria, WBC 6-10), pending Covid PCR, potassium 3.2, renal function okay, negative pregnancy test yesterday, temperature normal, blood pressure 117/84, heart rate 60, RR 16, oxygen saturation 99% on room air.  Patient is placed on MedSurg bed for observation.   Review of Systems:   General: no fevers, chills, no body weight gain, has poor appetite, has fatigue HEENT: no blurry vision, hearing changes or sore throat Respiratory: no dyspnea, coughing, wheezing CV: no chest pain, no palpitations GI: has nausea, vomiting, abdominal pain, diarrhea, no  constipation GU: no dysuria, burning on urination, increased urinary frequency, hematuria  Ext: no leg edema Neuro: no unilateral weakness, numbness, or tingling, no vision change or hearing loss Skin: no rash, no skin tear. MSK: No muscle spasm, no deformity, no limitation of range of movement in spin Heme: No easy bruising.  Travel history: No recent long distant travel.  Allergy:  Allergies  Allergen Reactions  . Aspirin Rash    Pt lists as an allergy due to strong family hx.  . Penicillins Rash    As a child     Past Medical History:  Diagnosis Date  . Medical history non-contributory     Past Surgical History:  Procedure Laterality Date  . DILATION AND CURETTAGE OF UTERUS N/A 05/18/2016   Procedure: SUCTION DILATATION AND CURETTAGE;  Surgeon: Tilda Burrow, MD;  Location: [facility];  Service: Gynecology;  Laterality: N/A;    Social History:  reports that she quit smoking about 5 years ago. She has never used smokeless tobacco. She reports that she does not drink alcohol and does not use drugs.  Family History: Reviewed with patient, but patient states that all family members are healthy without significant medical issues.   Prior to Admission medications   Medication Sig Start Date End Date Taking? Authorizing Provider  doxycycline (VIBRAMYCIN) 100 MG capsule Take 1 capsule (100 mg total) by mouth 2 (two) times daily for 14 days. 09/07/20 09/21/20  Shaune Pollack, MD  metroNIDAZOLE (FLAGYL) 500 MG tablet Take 1 tablet (500 mg total) by mouth 2 (two) times daily for 14 days. 09/07/20 09/21/20  Shaune Pollack, MD  ondansetron (  ZOFRAN ODT) 4 MG disintegrating tablet Take 1 tablet (4 mg total) by mouth every 8 (eight) hours as needed for nausea or vomiting. 09/07/20   Shaune Pollack, MD  promethazine (PHENERGAN) 25 MG suppository Place 1 suppository (25 mg total) rectally every 8 (eight) hours as needed for refractory nausea / vomiting. 09/07/20 09/07/21  Shaune Pollack, MD   metoCLOPramide (REGLAN) 10 MG tablet Take 1 tablet (10 mg total) by mouth every 6 (six) hours as needed for nausea (nausea/headache). 08/27/17 07/26/20  Gilda Crease, MD    Physical Exam: Vitals:   09/08/20 1323 09/08/20 1325 09/08/20 1647  BP:  117/84 113/61  Pulse:  60 81  Resp:  16 18  Temp:  98.4 F (36.9 C) 98.4 F (36.9 C)  TempSrc:  Oral Oral  SpO2:  99% 98%  Weight: 57.2 kg     General: Not in acute distress HEENT:       Eyes: PERRL, EOMI, no scleral icterus.       ENT: No discharge from the ears and nose, no pharynx injection, no tonsillar enlargement.        Neck: No JVD, no bruit, no mass felt. Heme: No neck lymph node enlargement. Cardiac: S1/S2, RRR, No murmurs, No gallops or rubs. Respiratory: No rales, wheezing, rhonchi or rubs. GI: Soft, nondistended, has mild tenderness in lower abdomen, no rebound pain, no organomegaly, BS present. GU: No hematuria Ext: No pitting leg edema bilaterally. 2+DP/PT pulse bilaterally. Musculoskeletal: No joint deformities, No joint redness or warmth, no limitation of ROM in spin. Skin: No rashes.  Neuro: Alert, oriented X3, cranial nerves II-XII grossly intact, moves all extremities normally. Psych: Patient is not psychotic, no suicidal or hemocidal ideation.  Labs on Admission: I have personally reviewed following labs and imaging studies  CBC: Recent Labs  Lab 09/07/20 1548 09/08/20 1325  WBC 15.4* 15.1*  HGB 13.2 12.7  HCT 39.5 37.0  MCV 86.6 86.0  PLT 360 351   Basic Metabolic Panel: Recent Labs  Lab 09/07/20 1548 09/08/20 1325  NA 136 137  K 4.1 3.2*  CL 101 98  CO2 24 28  GLUCOSE 106* 96  BUN 13 11  CREATININE 0.79 0.85  CALCIUM 9.3 9.0  MG  --  1.9   GFR: Estimated Creatinine Clearance: 80.7 mL/min (by C-G formula based on SCr of 0.85 mg/dL). Liver Function Tests: Recent Labs  Lab 09/07/20 1548 09/08/20 1325  AST 19 18  ALT 23 20  ALKPHOS 47 39  BILITOT 2.2* 2.2*  PROT 7.9 7.0   ALBUMIN 4.7 4.3   Recent Labs  Lab 09/07/20 1548 09/08/20 1325  LIPASE 124* 21   No results for input(s): AMMONIA in the last 168 hours. Coagulation Profile: No results for input(s): INR, PROTIME in the last 168 hours. Cardiac Enzymes: No results for input(s): CKTOTAL, CKMB, CKMBINDEX, TROPONINI in the last 168 hours. BNP (last 3 results) No results for input(s): PROBNP in the last 8760 hours. HbA1C: No results for input(s): HGBA1C in the last 72 hours. CBG: No results for input(s): GLUCAP in the last 168 hours. Lipid Profile: No results for input(s): CHOL, HDL, LDLCALC, TRIG, CHOLHDL, LDLDIRECT in the last 72 hours. Thyroid Function Tests: No results for input(s): TSH, T4TOTAL, FREET4, T3FREE, THYROIDAB in the last 72 hours. Anemia Panel: No results for input(s): VITAMINB12, FOLATE, FERRITIN, TIBC, IRON, RETICCTPCT in the last 72 hours. Urine analysis:    Component Value Date/Time   COLORURINE YELLOW (A) 09/08/2020 1325   APPEARANCEUR  HAZY (A) 09/08/2020 1325   APPEARANCEUR Clear 03/07/2015 1459   LABSPEC 1.029 09/08/2020 1325   LABSPEC 1.012 03/07/2015 1459   PHURINE 6.0 09/08/2020 1325   GLUCOSEU NEGATIVE 09/08/2020 1325   GLUCOSEU Negative 03/07/2015 1459   HGBUR NEGATIVE 09/08/2020 1325   BILIRUBINUR NEGATIVE 09/08/2020 1325   BILIRUBINUR Negative 03/07/2015 1459   KETONESUR 20 (A) 09/08/2020 1325   PROTEINUR 30 (A) 09/08/2020 1325   NITRITE NEGATIVE 09/08/2020 1325   LEUKOCYTESUR TRACE (A) 09/08/2020 1325   LEUKOCYTESUR Negative 03/07/2015 1459   Sepsis Labs: @LABRCNTIP (procalcitonin:4,lacticidven:4) ) Recent Results (from the past 240 hour(s))  Chlamydia/NGC rt PCR ([facility] only)     Status: None   Collection Time: 09/07/20  3:48 PM   Specimen: Urine, Clean Catch  Result Value Ref Range Status   Specimen source GC/Chlam URINE, RANDOM  Final   Chlamydia Tr NOT DETECTED NOT DETECTED Final   N gonorrhoeae NOT DETECTED NOT DETECTED Final    Comment:  (NOTE) This CT/NG assay has not been evaluated in patients with a history of  hysterectomy. Performed at [facility], [address] [place], [place]   Respiratory Panel by RT PCR (Flu A&B, Covid) - Nasopharyngeal Swab     Status: None   Collection Time: 09/08/20  4:09 PM   Specimen: Nasopharyngeal Swab  Result Value Ref Range Status   SARS Coronavirus 2 by RT PCR NEGATIVE NEGATIVE Final    Comment: (NOTE) SARS-CoV-2 target nucleic acids are NOT DETECTED.  The SARS-CoV-2 RNA is generally detectable in upper respiratoy specimens during the acute phase of infection. The lowest concentration of SARS-CoV-2 viral copies this assay can detect is 131 copies/mL. A negative result does not preclude SARS-Cov-2 infection and should not be used as the sole basis for treatment or other patient management decisions. A negative result may occur with  improper specimen collection/handling, submission of specimen other than nasopharyngeal swab, presence of viral mutation(s) within the areas targeted by this assay, and inadequate number of viral copies (<131 copies/mL). A negative result must be combined with clinical observations, patient history, and epidemiological information. The expected result is Negative.  Fact Sheet for Patients:  09/10/20  Fact Sheet for Healthcare Providers:  https://www.moore.com/  This test is no t yet approved or cleared by the https://www.young.biz/ FDA and  has been authorized for detection and/or diagnosis of SARS-CoV-2 by FDA under an Emergency Use Authorization (EUA). This EUA will remain  in effect (meaning this test can be used) for the duration of the COVID-19 declaration under Section 564(b)(1) of the Act, 21 U.S.C. section 360bbb-3(b)(1), unless the authorization is terminated or revoked sooner.     Influenza A by PCR NEGATIVE NEGATIVE Final   Influenza B by PCR NEGATIVE NEGATIVE Final     Comment: (NOTE) The Xpert Xpress SARS-CoV-2/FLU/RSV assay is intended as an aid in  the diagnosis of influenza from Nasopharyngeal swab specimens and  should not be used as a sole basis for treatment. Nasal washings and  aspirates are unacceptable for Xpert Xpress SARS-CoV-2/FLU/RSV  testing.  Fact Sheet for Patients: [place]  Fact Sheet for Healthcare Providers: https://www.moore.com/  This test is not yet approved or cleared by the https://www.young.biz/ FDA and  has been authorized for detection and/or diagnosis of SARS-CoV-2 by  FDA under an Emergency Use Authorization (EUA). This EUA will remain  in effect (meaning this test can be used) for the duration of the  Covid-19 declaration under Section 564(b)(1) of the Act, 21  U.S.C. section 360bbb-3(b)(1), unless the authorization is  terminated or revoked. Performed at [facility], [address] [place], [place] [address]      Radiological Exams on Admission: CT ABDOMEN PELVIS W CONTRAST  Result Date: 09/07/2020 CLINICAL DATA:  Lower abdominal pain and vomiting. EXAM: CT ABDOMEN AND PELVIS WITH CONTRAST TECHNIQUE: Multidetector CT imaging of the abdomen and pelvis was performed using the standard protocol following bolus administration of intravenous contrast. CONTRAST:  OMNIPAQUE IOHEXOL 300 MG/ML  SOLN COMPARISON:  July 27, 2020 FINDINGS: Lower chest: No acute abnormality. Hepatobiliary: No focal liver abnormality is seen. No gallstones, gallbladder wall thickening, or biliary dilatation. Pancreas: Unremarkable. No pancreatic ductal dilatation or surrounding inflammatory changes. Spleen: Normal in size without focal abnormality. Adrenals/Urinary Tract: Adrenal glands are unremarkable. Kidneys are normal, without renal calculi, focal lesion, or hydronephrosis. Bladder is unremarkable. Stomach/Bowel: Stomach is within normal limits. Appendix appears normal. No  evidence of bowel wall thickening, distention, or inflammatory changes. Vascular/Lymphatic: No significant vascular findings are present. No enlarged abdominal or pelvic lymph nodes. Reproductive: Uterus is unremarkable. A 1.0 cm x 0.7 cm cyst is seen along the anterior aspect of the right adnexa. A 0.7 cm diameter tubular appearing area of fluid attenuation is seen along the posteromedial aspect of the right adnexa (axial CT images 67 through 70, CT series number 2/coronal reformatted images 48 through 53, CT series number 5). Other: No abdominal wall hernia or abnormality. A very small amount of posterior pelvic fluid is seen. Musculoskeletal: No acute or significant osseous findings. IMPRESSION: 1. Small right adnexal cyst, likely ovarian in origin. 2. 0.7 cm diameter tubular appearing area of fluid attenuation along the posteromedial aspect of the right adnexa which may represent a small hydrosalpinx. Correlation with pelvic ultrasound is recommended. Electronically Signed   By: Aram Candela M.D.   On: 09/07/2020 20:25   US PELVIC COMPLETE W TRANSVAGINAL AND TORSION R/O  Result Date: 09/07/2020 CLINICAL DATA:  Lower abdominal pain for 2 days, negative UPT EXAM: TRANSABDOMINAL AND TRANSVAGINAL ULTRASOUND OF PELVIS DOPPLER ULTRASOUND OF OVARIES TECHNIQUE: Both transabdominal and transvaginal ultrasound examinations of the pelvis were performed. Transabdominal technique was performed for global imaging of the pelvis including uterus, ovaries, adnexal regions, and pelvic cul-de-sac. It was necessary to proceed with endovaginal exam following the transabdominal exam to visualize the ovaries. Color and duplex Doppler ultrasound was utilized to evaluate blood flow to the ovaries. COMPARISON:  Obstetrical ultrasound 05/16/2016, CT 09/07/2020 FINDINGS: Uterus Measurements: 6.7 x 4.6 x 5.2 cm = volume: 85.1 mL. Retroverted and retroflexed uterus. No focal myometrial abnormality. No discernible fibroids or other  concerning uterine masses. Suspect few anechoic nabothian cysts towards the level of the cervix. Endometrium Thickness: 7.2 mm, non thickened.  No focal abnormality visualized. Right ovary Measurements: 3.7 x 2.1 x 2.3 cm = volume: 9.3 mL. Multiple anechoic simple appearing follicles are seen in the right ovary. Largest measuring 1.4 x 1.2 x 1.2 cm without concerning internal features. No worrisome adnexal lesion or discernible hydrosalpinx. Left ovary Measurements: 2.9 x 1.4 x 1.3 cm = volume: 2.7 mL. Normal follicles. No concerning adnexal lesion. Pulsed Doppler evaluation of both ovaries demonstrates normal low-resistance arterial and venous waveforms. Other findings Trace to small volume anechoic free fluid in the deep pelvis, nonspecific and possibly physiologic in this reproductive age female. IMPRESSION: Multiple anechoic follicles are seen within the right adnexa. No demonstrable hydrosalpinx or tubo-ovarian abscess is seen. No evidence of ovarian torsion or other acute pelvic abnormality.  Trace to small volume anechoic free fluid in the deep pelvis, nonspecific though possibly physiologic in a reproductive age female. Electronically Signed   By: Kreg ShropshirePrice  DeHay M.D.   On: 09/07/2020 23:01     EKG: I have personally reviewed.  Sinus rhythm, QTC 401, no ischemic change.  Assessment/Plan Principal Problem:   UTI (urinary tract infection) Active Problems:   Nausea, vomiting and diarrhea   Abdominal pain   Hypokalemia   Bacterial vaginosis   UTI (urinary tract infection): Patient has leukocytosis, but no fever. Not septic clinically. Hemodynamically stable -Placed on MedSurg bed for observation -IV Rocephin -Follow-up blood culture and urine culture  Nausea, vomiting and diarrhea and lower abdominal pain: May be due to viral gastroenteritis. Lower abdominal pain may be due to UTI. Her diarrhea has resolved. Patient had lipase 124 yesterday, indicating possible pancreatitis, but the repeated  lipase today is 21. -As needed Zofran and morphine -IV fluid: 1 L LR, then 125 cc/h  Hypokalemia: -Repleted potassium -Check magnesium level  Bacterial vaginosis -Continue Flagyl   DVT ppx: SQ Lovenox Code Status: Full code Family Communication:     Yes, patient's friend at bed side Disposition Plan:  Anticipate discharge back to previous environment Consults called:  none Admission status: Med-surg bed for obs    Status is: Observation  The patient remains OBS appropriate and will d/c before 2 midnights.  Dispo: The patient is from: Home              Anticipated d/c is to: Home              Anticipated d/c date is: 1 day              Patient currently is not medically stable to d/c.          Date of Service 09/08/2020    Lorretta HarpXilin Jock Mahon [facility]   If 7PM-7AM, please contact night-coverage [email or web address] 09/08/2020, 7:23 PM

## 2020-09-08 NOTE — ED Triage Notes (Signed)
Pt in w/complaints of n/v/d x 4 days. States she came to ED yesterday, got 2 bags of fluid and Zofran ODT for home, but is getting no relief. C/o epigastric pain, is unable to keep anything down.

## 2020-09-08 NOTE — ED Provider Notes (Addendum)
[facility] Emergency Department Provider Note  ____________________________________________   First MD Initiated Contact with Patient 09/08/20 1457     (approximate)  I have reviewed the triage vital signs and the nursing notes.   HISTORY  Chief Complaint Abdominal Pain, Emesis, and Diarrhea    HPI Taylor Miller is a 24 y.o. female  Here with nausea, vomiting. Pt was seen yesterday and treated by myself. She reports a several day h/o worsening nausea, vomiting, and intermittent cramp like abd pain. CT scan, U/S were unremarkable at that time. UA with possible pyuria and she was treated for possible GU infection. Pt had no vomiting in ED but upon returning home, states that she had ongoing, recurrent n/v and has been unable to eat or drink. She has not been able to keep anything down. She reports ongoing fatigue, nausea. No known fevers but has had some chills. She reports decreased UOP and dark urine, though no overt dysuria or frequency. Has been taking Zofran without relief.         Past Medical History:  Diagnosis Date  . Medical history non-contributory     Patient Active Problem List   Diagnosis Date Noted  . Missed abortion 05/16/2016    Past Surgical History:  Procedure Laterality Date  . DILATION AND CURETTAGE OF UTERUS N/A 05/18/2016   Procedure: SUCTION DILATATION AND CURETTAGE;  Surgeon: Tilda Burrow, MD;  Location: [facility];  Service: Gynecology;  Laterality: N/A;  . NO PAST SURGERIES      Prior to Admission medications   Medication Sig Start Date End Date Taking? Authorizing Provider  doxycycline (VIBRAMYCIN) 100 MG capsule Take 1 capsule (100 mg total) by mouth 2 (two) times daily for 14 days. 09/07/20 09/21/20  Shaune Pollack, MD  metroNIDAZOLE (FLAGYL) 500 MG tablet Take 1 tablet (500 mg total) by mouth 2 (two) times daily for 14 days. 09/07/20 09/21/20  Shaune Pollack, MD  ondansetron (ZOFRAN ODT) 4 MG disintegrating tablet  Take 1 tablet (4 mg total) by mouth every 8 (eight) hours as needed for nausea or vomiting. 09/07/20   Shaune Pollack, MD  promethazine (PHENERGAN) 25 MG suppository Place 1 suppository (25 mg total) rectally every 8 (eight) hours as needed for refractory nausea / vomiting. 09/07/20 09/07/21  Shaune Pollack, MD  metoCLOPramide (REGLAN) 10 MG tablet Take 1 tablet (10 mg total) by mouth every 6 (six) hours as needed for nausea (nausea/headache). 08/27/17 07/26/20  Gilda Crease, MD    Allergies Aspirin and Penicillins  No family history on file.  Social History Social History   Tobacco Use  . Smoking status: Former Smoker    Quit date: 05/17/2015    Years since quitting: 5.3  . Smokeless tobacco: Never Used  Substance Use Topics  . Alcohol use: No  . Drug use: No    Review of Systems  Review of Systems  Constitutional: Positive for fatigue. Negative for fever.  HENT: Negative for congestion and sore throat.   Eyes: Negative for visual disturbance.  Respiratory: Negative for cough and shortness of breath.   Cardiovascular: Negative for chest pain.  Gastrointestinal: Positive for diarrhea, nausea and vomiting. Negative for abdominal pain.  Genitourinary: Negative for flank pain.  Musculoskeletal: Negative for back pain and neck pain.  Skin: Negative for rash and wound.  Neurological: Positive for weakness.  All other systems reviewed and are negative.    ____________________________________________  PHYSICAL EXAM:      VITAL SIGNS: ED Triage Vitals  Enc Vitals Group     BP 09/08/20 1325 117/84     Pulse Rate 09/08/20 1325 60     Resp 09/08/20 1325 16     Temp 09/08/20 1325 98.4 F (36.9 C)     Temp Source 09/08/20 1325 Oral     SpO2 09/08/20 1325 99 %     Weight 09/08/20 1323 126 lb 1.7 oz (57.2 kg)     Height --      Head Circumference --      Peak Flow --      Pain Score 09/08/20 1322 10     Pain Loc --      Pain Edu? --      Excl. in GC? --       Physical Exam Vitals and nursing note reviewed.  Constitutional:      General: She is not in acute distress.    Appearance: She is well-developed.  HENT:     Head: Normocephalic and atraumatic.     Comments: Dry MM Eyes:     Conjunctiva/sclera: Conjunctivae normal.  Cardiovascular:     Rate and Rhythm: Normal rate and regular rhythm.     Heart sounds: Normal heart sounds. No murmur heard.  No friction rub.  Pulmonary:     Effort: Pulmonary effort is normal. No respiratory distress.     Breath sounds: Normal breath sounds. No wheezing or rales.  Abdominal:     General: There is no distension.     Palpations: Abdomen is soft.     Tenderness: There is generalized abdominal tenderness (minimal). There is no guarding or rebound.  Musculoskeletal:     Cervical back: Neck supple.  Skin:    General: Skin is warm.     Capillary Refill: Capillary refill takes less than 2 seconds.  Neurological:     Mental Status: She is alert and oriented to person, place, and time.     Motor: No abnormal muscle tone.       ____________________________________________   LABS (all labs ordered are listed, but only abnormal results are displayed)  Labs Reviewed  COMPREHENSIVE METABOLIC PANEL - Abnormal; Notable for the following components:      Result Value   Potassium 3.2 (*)    Total Bilirubin 2.2 (*)    All other components within normal limits  CBC - Abnormal; Notable for the following components:   WBC 15.1 (*)    All other components within normal limits  URINALYSIS, COMPLETE (UACMP) WITH MICROSCOPIC - Abnormal; Notable for the following components:   Color, Urine YELLOW (*)    APPearance HAZY (*)    Ketones, ur 20 (*)    Protein, ur 30 (*)    Leukocytes,Ua TRACE (*)    All other components within normal limits  RESPIRATORY PANEL BY RT PCR (FLU A&B, COVID)  LIPASE, BLOOD  PREGNANCY, URINE  URINE DRUG SCREEN, QUALITATIVE ([facility] ONLY)     ____________________________________________  EKG: Normal sinus rhythm, VR 62. PR 142, QRS 80, QTc 401. No acute St elevations or depressions. No ischemai or infarct. ________________________________________  RADIOLOGY All imaging, including plain films, CT scans, and ultrasounds, independently reviewed by me, and interpretations confirmed via formal radiology reads.  ED MD interpretation:   CT A/P, U/S reviewed from yesterday and largely unremarkable  Official radiology report(s): CT ABDOMEN PELVIS W CONTRAST  Result Date: 09/07/2020 CLINICAL DATA:  Lower abdominal pain and vomiting. EXAM: CT ABDOMEN AND PELVIS WITH CONTRAST TECHNIQUE: Multidetector CT imaging of the abdomen  and pelvis was performed using the standard protocol following bolus administration of intravenous contrast. CONTRAST:  OMNIPAQUE IOHEXOL 300 MG/ML  SOLN COMPARISON:  July 27, 2020 FINDINGS: Lower chest: No acute abnormality. Hepatobiliary: No focal liver abnormality is seen. No gallstones, gallbladder wall thickening, or biliary dilatation. Pancreas: Unremarkable. No pancreatic ductal dilatation or surrounding inflammatory changes. Spleen: Normal in size without focal abnormality. Adrenals/Urinary Tract: Adrenal glands are unremarkable. Kidneys are normal, without renal calculi, focal lesion, or hydronephrosis. Bladder is unremarkable. Stomach/Bowel: Stomach is within normal limits. Appendix appears normal. No evidence of bowel wall thickening, distention, or inflammatory changes. Vascular/Lymphatic: No significant vascular findings are present. No enlarged abdominal or pelvic lymph nodes. Reproductive: Uterus is unremarkable. A 1.0 cm x 0.7 cm cyst is seen along the anterior aspect of the right adnexa. A 0.7 cm diameter tubular appearing area of fluid attenuation is seen along the posteromedial aspect of the right adnexa (axial CT images 67 through 70, CT series number 2/coronal reformatted images 48 through  53, CT series number 5). Other: No abdominal wall hernia or abnormality. A very small amount of posterior pelvic fluid is seen. Musculoskeletal: No acute or significant osseous findings. IMPRESSION: 1. Small right adnexal cyst, likely ovarian in origin. 2. 0.7 cm diameter tubular appearing area of fluid attenuation along the posteromedial aspect of the right adnexa which may represent a small hydrosalpinx. Correlation with pelvic ultrasound is recommended. Electronically Signed   By: Aram Candela M.D.   On: 09/07/2020 20:25   US PELVIC COMPLETE W TRANSVAGINAL AND TORSION R/O  Result Date: 09/07/2020 CLINICAL DATA:  Lower abdominal pain for 2 days, negative UPT EXAM: TRANSABDOMINAL AND TRANSVAGINAL ULTRASOUND OF PELVIS DOPPLER ULTRASOUND OF OVARIES TECHNIQUE: Both transabdominal and transvaginal ultrasound examinations of the pelvis were performed. Transabdominal technique was performed for global imaging of the pelvis including uterus, ovaries, adnexal regions, and pelvic cul-de-sac. It was necessary to proceed with endovaginal exam following the transabdominal exam to visualize the ovaries. Color and duplex Doppler ultrasound was utilized to evaluate blood flow to the ovaries. COMPARISON:  Obstetrical ultrasound 05/16/2016, CT 09/07/2020 FINDINGS: Uterus Measurements: 6.7 x 4.6 x 5.2 cm = volume: 85.1 mL. Retroverted and retroflexed uterus. No focal myometrial abnormality. No discernible fibroids or other concerning uterine masses. Suspect few anechoic nabothian cysts towards the level of the cervix. Endometrium Thickness: 7.2 mm, non thickened.  No focal abnormality visualized. Right ovary Measurements: 3.7 x 2.1 x 2.3 cm = volume: 9.3 mL. Multiple anechoic simple appearing follicles are seen in the right ovary. Largest measuring 1.4 x 1.2 x 1.2 cm without concerning internal features. No worrisome adnexal lesion or discernible hydrosalpinx. Left ovary Measurements: 2.9 x 1.4 x 1.3 cm = volume: 2.7 mL.  Normal follicles. No concerning adnexal lesion. Pulsed Doppler evaluation of both ovaries demonstrates normal low-resistance arterial and venous waveforms. Other findings Trace to small volume anechoic free fluid in the deep pelvis, nonspecific and possibly physiologic in this reproductive age female. IMPRESSION: Multiple anechoic follicles are seen within the right adnexa. No demonstrable hydrosalpinx or tubo-ovarian abscess is seen. No evidence of ovarian torsion or other acute pelvic abnormality. Trace to small volume anechoic free fluid in the deep pelvis, nonspecific though possibly physiologic in a reproductive age female. Electronically Signed   By: Kreg Shropshire M.D.   On: 09/07/2020 23:01    ____________________________________________  PROCEDURES   Procedure(s) performed (including Critical Care):  Procedures  ____________________________________________  INITIAL IMPRESSION / MDM / ASSESSMENT AND PLAN / ED COURSE  As part of my medical decision making, I reviewed the following data within the electronic MEDICAL RECORD NUMBER Nursing notes reviewed and incorporated, Old chart reviewed, Notes from prior ED visits, and [place] Controlled Substance Database       *Sharyne PeachBrooke M Siebels was evaluated in Emergency Department on 09/08/2020 for the symptoms described in the history of present illness. She was evaluated in the context of the global COVID-19 pandemic, which necessitated consideration that the patient might be at risk for infection with the SARS-CoV-2 virus that causes COVID-19. Institutional protocols and algorithms that pertain to the evaluation of patients at risk for COVID-19 are in a state of rapid change based on information released by regulatory bodies including the CDC and federal and state organizations. These policies and algorithms were followed during the patient's care in the ED.  Some ED evaluations and interventions may be delayed as a result of limited staffing during the  pandemic.*     Medical Decision Making:  24 yo F here with intractable n/v in setting of possible UTI, less likely PID. On arrival, pt is afebrile, HDS. Labs show persistent mild leukocytosis, ketonuria c/w dehydration. Mild pyuria again noted. Reviewed CT, imaging from recent ED visit - no surgical abnormality noted and pt has no significant TTP on exam to suggest cholecystitis, appendicitis, surgical abnormality. Given her intractable n/v, will admit for fluids, rehydration, monitoring. Urine gc/c pending but doubtful it is true PID. Will start Rocephin in event of UTI while UCx is pending. Otherwise, haldol given for possible component of CHS, [facility]  ADDENDUM: Patient requested to leave AMA while awaiting bed. She is calm, cooperative, but would like to attempt outpt management. Had a long discussion with her that she is at high risk of worsening dehydration, infection without admission but she feels better and wants to try sx control at home. Will add phenergan PO and d/c with strict instructions to return if vomiting returns. Avoid THC. Signed out AMA. ____________________________________________  FINAL CLINICAL IMPRESSION(S) / ED DIAGNOSES  Final diagnoses:  Intractable vomiting with nausea, unspecified vomiting type     MEDICATIONS GIVEN DURING THIS VISIT:  Medications  lactated ringers infusion ( Intravenous New Bag/Given 09/08/20 1524)  cefTRIAXone (ROCEPHIN) 1 g in sodium chloride 0.9 % 100 mL IVPB (has no administration in time range)  lactated ringers bolus 1,000 mL (1,000 mLs Intravenous New Bag/Given 09/08/20 1524)  haloperidol lactate (HALDOL) injection 2.5 mg (2.5 mg Intravenous Given 09/08/20 1524)     ED Discharge Orders    None       Note:  This document was prepared using Dragon voice recognition software and may include unintentional dictation errors.   Shaune PollackIsaacs, Aiesha Leland, MD 09/08/20 1525    Shaune PollackIsaacs, Demaria Deeney, MD 09/08/20 [phone]

## 2020-09-08 NOTE — Discharge Instructions (Addendum)
Take the Phenergan every 6-8 hours for the next several days  As we discussed, we recommend admission but if unable to stay, try to take the meds at home and return if you're unable to eat/drink

## 2020-09-08 NOTE — ED Notes (Addendum)
Pt expressing wishes to leave. This RN at bedside to explain benefits of staying and need for IV hydration due to patient being unable to tolerate PO medications, fluids and food. MD Niu aware. MD Isaacs at bedside to speak with patient at this time. Pt states she has a plan in order to keep herself hydrated, and that she will be back tomorrow if unable to keep anything down.

## 2020-09-09 LAB — URINE CULTURE: Culture: <10000 — AB

## 2020-11-28 ENCOUNTER — Emergency Department
Admission: EM | Admit: 2020-11-28 | Discharge: 2020-11-28 | Disposition: A | Discharge status: Home / Self Care | Payer: BC Managed Care – PPO | Attending: Emergency Medicine | Admitting: Emergency Medicine

## 2020-11-28 ENCOUNTER — Emergency Department: Payer: BC Managed Care – PPO

## 2020-11-28 ENCOUNTER — Other Ambulatory Visit: Payer: Self-pay

## 2020-11-28 ENCOUNTER — Encounter: Payer: Self-pay | Admitting: Intensive Care

## 2020-11-28 DIAGNOSIS — Z20822 Contact with and (suspected) exposure to covid-19: Secondary | ICD-10-CM | POA: Diagnosis not present

## 2020-11-28 DIAGNOSIS — F17210 Nicotine dependence, cigarettes, uncomplicated: Secondary | ICD-10-CM | POA: Diagnosis not present

## 2020-11-28 DIAGNOSIS — R1010 Upper abdominal pain, unspecified: Secondary | ICD-10-CM | POA: Insufficient documentation

## 2020-11-28 DIAGNOSIS — R112 Nausea with vomiting, unspecified: Secondary | ICD-10-CM | POA: Diagnosis not present

## 2020-11-28 LAB — URINALYSIS, COMPLETE (UACMP) WITH MICROSCOPIC
Bilirubin Urine: NEGATIVE
Glucose, UA: NEGATIVE mg/dL
Ketones, ur: 80 mg/dL — AB
Nitrite: NEGATIVE
Protein, ur: 100 mg/dL — AB
Specific Gravity, Urine: 1.031 — ABNORMAL HIGH (ref 1.005–1.030)
pH: 6 (ref 5.0–8.0)

## 2020-11-28 LAB — COMPREHENSIVE METABOLIC PANEL
ALT: 27 U/L (ref 0–44)
AST: 19 U/L (ref 15–41)
Albumin: 4.5 g/dL (ref 3.5–5.0)
Alkaline Phosphatase: 49 U/L (ref 38–126)
Anion gap: 12 (ref 5–15)
BUN: 15 mg/dL (ref 6–20)
CO2: 28 mmol/L (ref 22–32)
Calcium: 9.5 mg/dL (ref 8.9–10.3)
Chloride: 100 mmol/L (ref 98–111)
Creatinine, Ser: 0.83 mg/dL (ref 0.44–1.00)
GFR, Estimated: >60 mL/min (ref >60)
Glucose, Bld: 106 mg/dL — ABNORMAL HIGH (ref 70–99)
Potassium: 3.9 mmol/L (ref 3.5–5.1)
Sodium: 140 mmol/L (ref 135–145)
Total Bilirubin: 3.2 mg/dL — ABNORMAL HIGH (ref 0.3–1.2)
Total Protein: 7.5 g/dL (ref 6.5–8.1)

## 2020-11-28 LAB — CBC
HCT: 37.2 % (ref 36.0–46.0)
Hemoglobin: 12.7 g/dL (ref 12.0–15.0)
MCH: 29.1 pg (ref 26.0–34.0)
MCHC: 34.1 g/dL (ref 30.0–36.0)
MCV: 85.1 fL (ref 80.0–100.0)
Platelets: 352 10*3/uL (ref 150–400)
RBC: 4.37 MIL/uL (ref 3.87–5.11)
RDW: 12.2 % (ref 11.5–15.5)
WBC: 15.4 10*3/uL — ABNORMAL HIGH (ref 4.0–10.5)
nRBC: 0 % (ref 0.0–0.2)

## 2020-11-28 LAB — LIPASE, BLOOD: Lipase: 27 U/L (ref 11–51)

## 2020-11-28 LAB — POC URINE PREG, ED: Preg Test, Ur: NEGATIVE

## 2020-11-28 IMAGING — CT CT RENAL STONE PROTOCOL
3 of 4 series · 7 of 46 positions shown, 13 images · non-contrast
Comparison: CT abdomen pelvis [DATE]

CLINICAL DATA: Patient c/o abd pain and emesis X3 days

EXAM:
CT ABDOMEN AND PELVIS WITHOUT CONTRAST
TECHNIQUE: Multidetector CT imaging of the abdomen and pelvis was performed
following the standard protocol without IV contrast.

[Series 4: lung bases · axial · 0.57mm/px · z∈[-92,-62]mm · 3 of 14 slices shown, 7 images]
[im 4/14  soft-tissue]
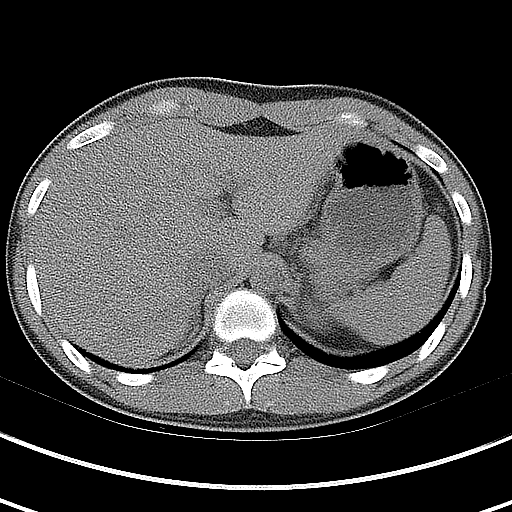
[im 4/14  lung]
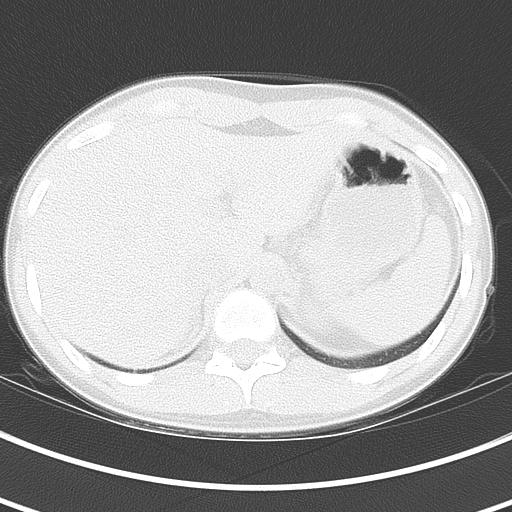
[im 4/14  bone]
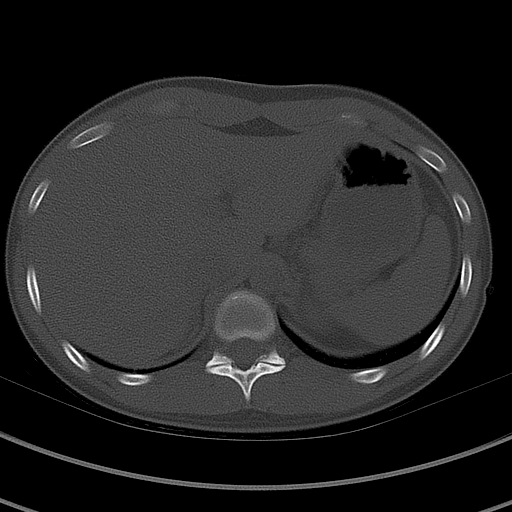
[im 7/14  soft-tissue]
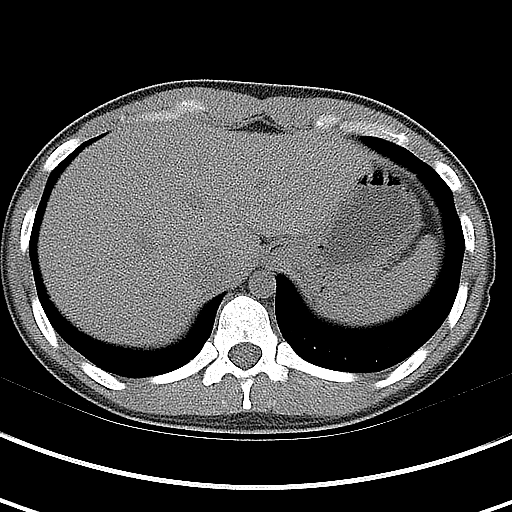
[im 7/14  lung]
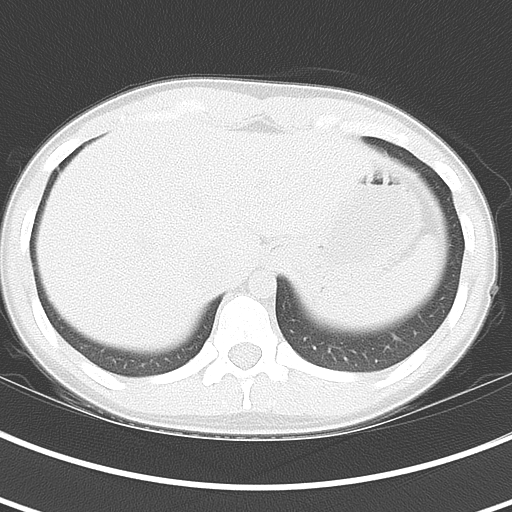
[im 10/14  soft-tissue]
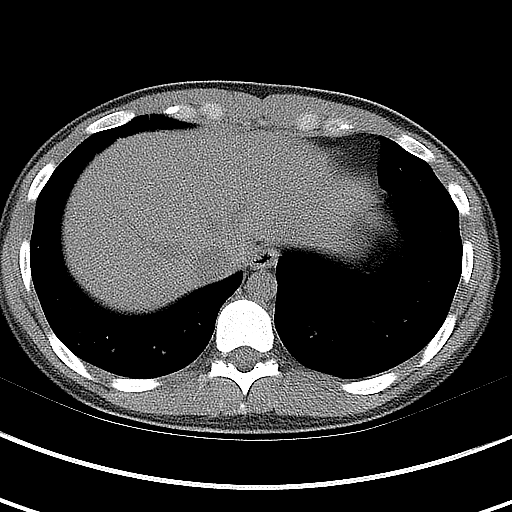
[im 10/14  lung]
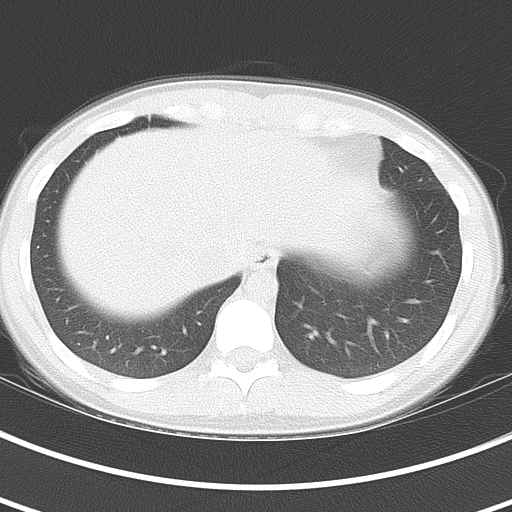

[Series 5: coronal · coronal · 0.65mm/px · 3 of 104 slices shown, 4 images]
[im 35/104  soft-tissue]
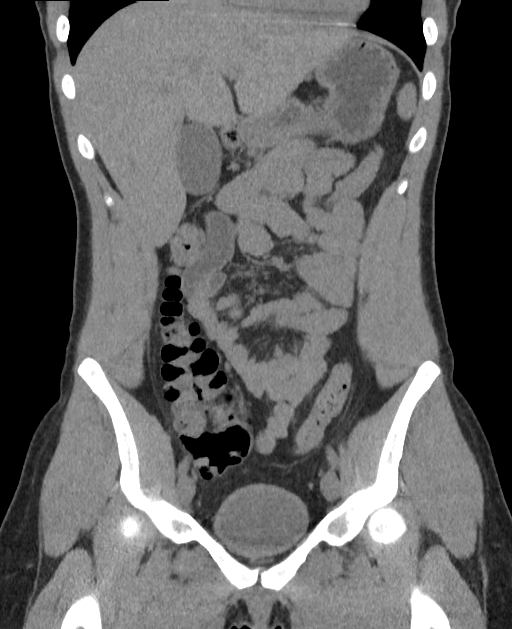
[im 46/104  soft-tissue]
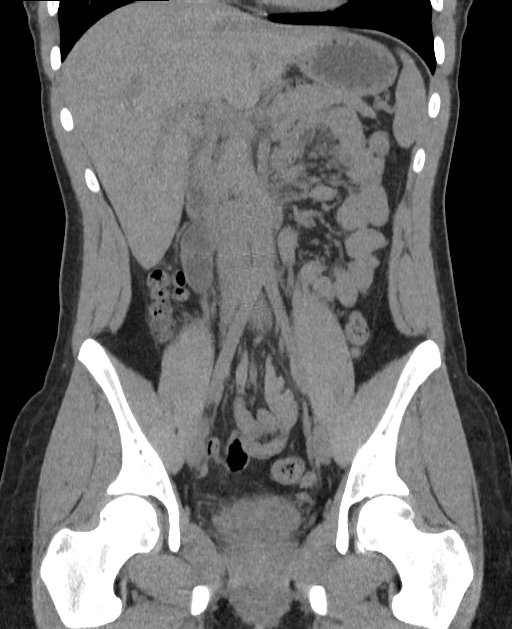
[im 46/104  bone]
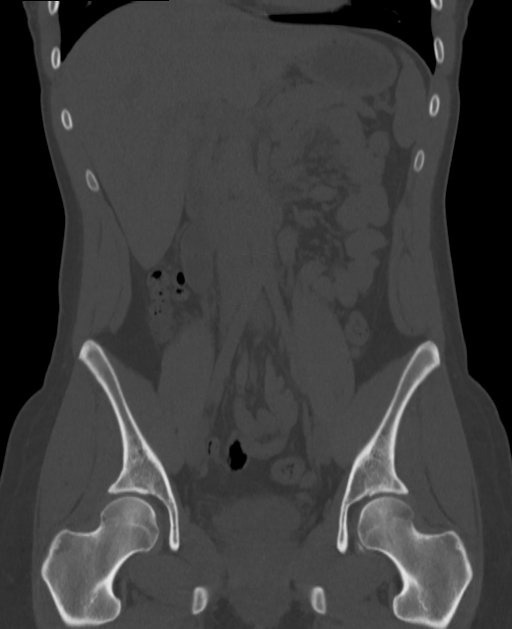
[im 58/104  soft-tissue]
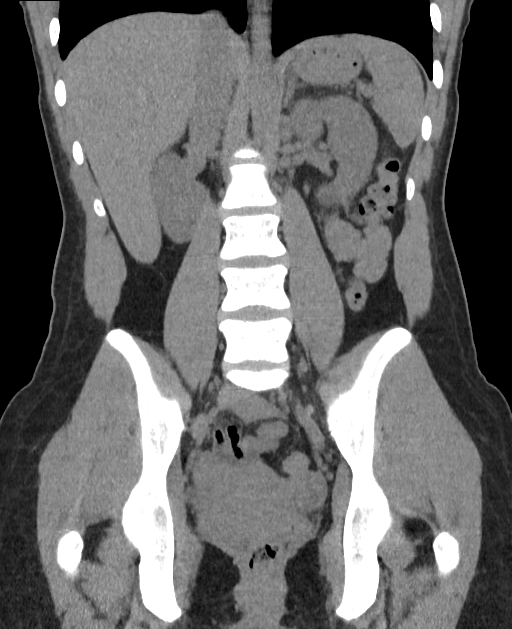

[Series 6: sagittal · sagittal · 0.40mm/px · 1 of 171 slices shown, 2 images]
[im 57/171  soft-tissue]
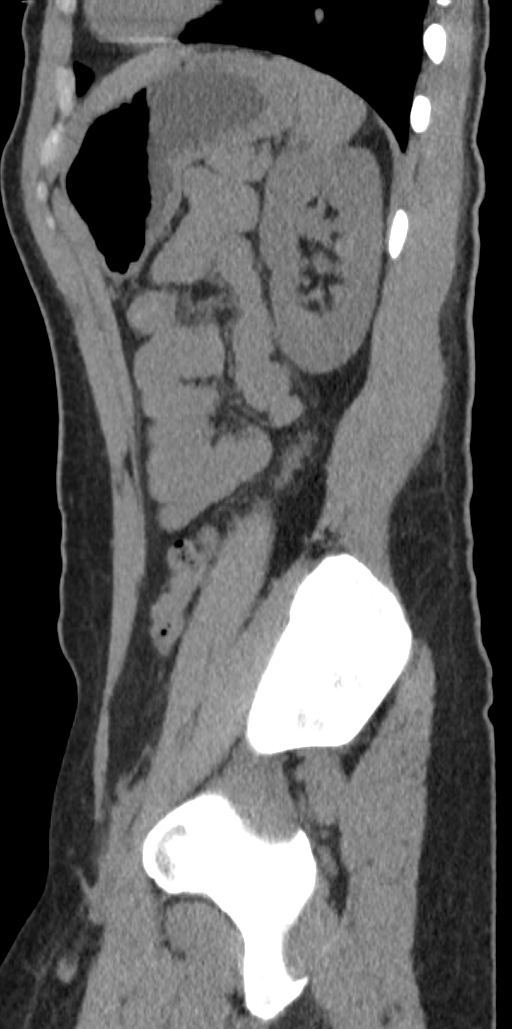
[im 57/171  bone]
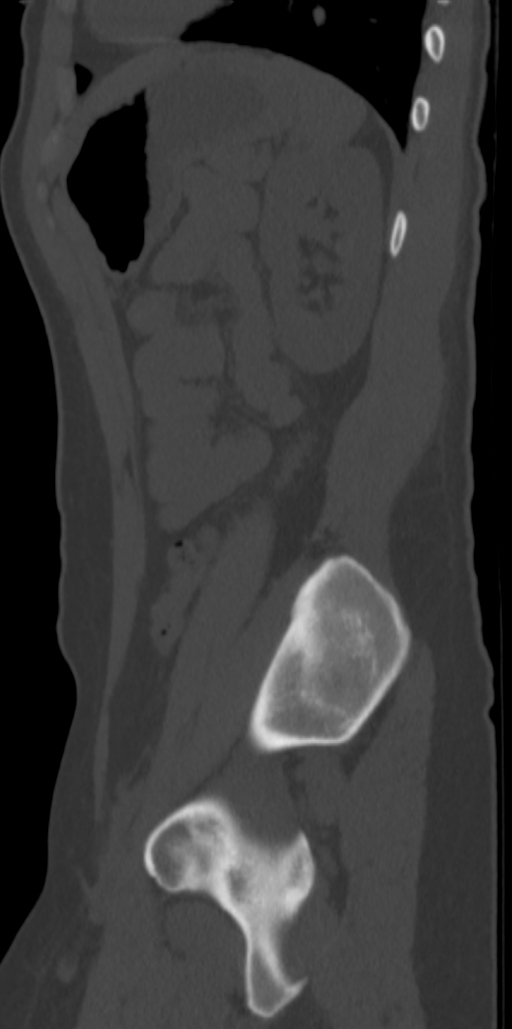

[7 of 46 positions shown; findings below may reference images not displayed]

FINDINGS: Lower chest: No acute abnormality.

Limited evaluation of the abdominal viscera given lack of IV
contrast.

Hepatobiliary: No focal liver abnormality is seen. Gallbladder is
unremarkable.

Pancreas: No surrounding inflammatory changes.

Spleen: Normal in size without focal abnormality.

Adrenals/Urinary Tract: Adrenal glands are unremarkable. Kidneys are
symmetric in size. No hydronephrosis or renal calculi identified.
Urinary bladder is unremarkable.

Stomach/Bowel: Stomach is within normal limits. Appendix appears
normal. No evidence of bowel wall thickening, distention, or
inflammatory changes.

Vascular/Lymphatic: Vascular patency cannot be assessed in the
absence of IV contrast. No enlarged abdominal or pelvic lymph nodes.

Reproductive: Uterus and bilateral adnexa are unremarkable.
Nonspecific mild free fluid in the pelvis, similar to prior.

Other: No abdominal wall hernia or abnormality.

Musculoskeletal: No acute or significant osseous findings.
IMPRESSION: 1. No evidence of acute intra-abdominal pathology on a noncontrast
exam. No renal calculi identified.
2. Nonspecific mild free fluid in the pelvis, similar to prior,
which may be physiologic in a reproductive age female.

## 2020-11-28 MED ORDER — PROMETHAZINE HCL 25 MG/ML IJ SOLN: 12.5000 mg | Freq: Once | INTRAMUSCULAR | Status: DC

## 2020-11-28 MED ORDER — SODIUM CHLORIDE 0.9 % IV BOLUS
1000.0000 mL | Freq: Once | INTRAVENOUS | Status: AC
  Administered 2020-11-28: 1000 mL via INTRAVENOUS

## 2020-11-28 MED ORDER — ONDANSETRON 4 MG PO TBDP
4.0000 mg | ORAL_TABLET | Freq: Once | ORAL | Status: AC
  Administered 2020-11-28: 4 mg via ORAL
  Filled 2020-11-28: qty 1

## 2020-11-28 MED ORDER — ONDANSETRON 4 MG PO TBDP: 4.0000 mg | ORAL_TABLET | Freq: Three times a day (TID) | ORAL | 0 refills | Status: DC | PRN

## 2020-11-28 MED ORDER — PROMETHAZINE HCL 12.5 MG PO TABS: 12.5000 mg | ORAL_TABLET | Freq: Four times a day (QID) | ORAL | 0 refills | Status: DC | PRN

## 2020-11-28 NOTE — Discharge Instructions (Addendum)
Please return for return of pain or return of vomiting.  Try the Zofran melt on your tongue wafers.  Use 1 wafer 3 times a day for nausea.  If you vomit once do not eat or drink anything for about a half an hour and then try sips of clear liquids in small amounts frequently for 3 to 4 hours and then try the brat diet bananas rice applesauce toast and crackers for 3 to 4 hours just nibble small amounts frequently of that 2.  If you are tolerating that well then eat regular food.  You can also try the Phenergan 1 pill 4 times a day if need be but you can vomit up the Phenergan you really cannot vomit up to melt on your tongue wafers.  Use 1 or the other or alternate them but do not use both the Phenergan and Zofran at once.  The COVID test results should be available 6 hours after you have the test done.  If you cannot get it tonight you should be to get it first thing in the morning tomorrow.

## 2020-11-28 NOTE — ED Triage Notes (Signed)
Patient c/o abd pain and emesis X3 days

## 2020-11-28 NOTE — ED Provider Notes (Signed)
[facility] Emergency Department Provider Note   ____________________________________________   Event Date/Time   First MD Initiated Contact with Patient 11/28/20 1704     (approximate)  I have reviewed the triage vital signs and the nursing notes.   HISTORY  Chief Complaint Abdominal Pain and Emesis    HPI Taylor Miller is a 25 y.o. female who comes in complaining of 3 days of nausea and vomiting.  She is not having any diarrhea.  She started with lower abdominal pain but now it is moved more to the upper part of the abdomen.  She reports she is doing okay after she sleeps and the pain is not bothering her but it was immediately after she tries to eat or drink anything she begins having nausea vomiting and feeling of pins-and-needles in her belly.          Past Medical History:  Diagnosis Date  . Medical history non-contributory     Patient Active Problem List   Diagnosis Date Noted  . UTI (urinary tract infection) 09/08/2020  . Nausea, vomiting and diarrhea 09/08/2020  . Abdominal pain 09/08/2020  . Hypokalemia 09/08/2020  . Bacterial vaginosis 09/08/2020  . Intractable vomiting   . Missed abortion 05/16/2016    Past Surgical History:  Procedure Laterality Date  . DILATION AND CURETTAGE OF UTERUS N/A 05/18/2016   Procedure: SUCTION DILATATION AND CURETTAGE;  Surgeon: Tilda Burrow, MD;  Location: [facility];  Service: Gynecology;  Laterality: N/A;    Prior to Admission medications   Medication Sig Start Date End Date Taking? Authorizing Provider  ondansetron (ZOFRAN ODT) 4 MG disintegrating tablet Take 1 tablet (4 mg total) by mouth every 8 (eight) hours as needed for nausea or vomiting. 11/28/20  Yes Arnaldo Natal, MD  promethazine (PHENERGAN) 12.5 MG tablet Take 1 tablet (12.5 mg total) by mouth every 6 (six) hours as needed for nausea or vomiting. 11/28/20  Yes Arnaldo Natal, MD  ondansetron (ZOFRAN ODT) 4 MG disintegrating tablet  Take 1 tablet (4 mg total) by mouth every 8 (eight) hours as needed for nausea or vomiting. 09/07/20   Shaune Pollack, MD  promethazine (PHENERGAN) 25 MG suppository Place 1 suppository (25 mg total) rectally every 8 (eight) hours as needed for refractory nausea / vomiting. 09/07/20 09/07/21  Shaune Pollack, MD  promethazine (PHENERGAN) 25 MG tablet Take 1 tablet (25 mg total) by mouth every 8 (eight) hours as needed for nausea or vomiting. 09/08/20   Shaune Pollack, MD  metoCLOPramide (REGLAN) 10 MG tablet Take 1 tablet (10 mg total) by mouth every 6 (six) hours as needed for nausea (nausea/headache). 08/27/17 07/26/20  Gilda Crease, MD    Allergies Aspirin and Penicillins  History reviewed. No pertinent family history.  Social History Social History   Tobacco Use  . Smoking status: Current Every Day Smoker    Types: Cigarettes  . Smokeless tobacco: Never Used  Substance Use Topics  . Alcohol use: No  . Drug use: Yes    Types: Marijuana    Review of Systems  Constitutional: No fever/chills Eyes: No visual changes. ENT: No sore throat. Cardiovascular: Denies chest pain. Respiratory: Denies shortness of breath. Gastrointestinal:  abdominal pain. nausea, vomiting.  No diarrhea.  No constipation. Genitourinary: Negative for dysuria. Musculoskeletal: Negative for back pain. Skin: Negative for rash. Neurological: Negative for headaches, focal weakness   ____________________________________________   PHYSICAL EXAM:  VITAL SIGNS: ED Triage Vitals [11/28/20 1405]  Enc Vitals Group  BP 121/73     Pulse Rate 61     Resp 18     Temp 98.6 F (37 C)     Temp Source Oral     SpO2 100 %     Weight 122 lb (55.3 kg)     Height 5\' 2"  (1.575 m)     Head Circumference      Peak Flow      Pain Score 9     Pain Loc      Pain Edu?      Excl. in GC?     Constitutional: Alert and oriented. Well appearing and in no acute distress. Eyes: Conjunctivae are normal.  PER Head: Atraumatic. Nose: No congestion/rhinnorhea. Mouth/Throat: Mucous membranes are moist.  Oropharynx non-erythematous. Neck: No stridor. Cardiovascular: Normal rate, regular rhythm. Grossly normal heart sounds.  Good peripheral circulation. Respiratory: Normal respiratory effort.  No retractions. Lungs CTAB. Gastrointestinal: Soft mildly tender to palpation in the upper abdomen only.  No distention. No abdominal bruits. No CVA tenderness. Musculoskeletal: No lower extremity tenderness nor edema.   Neurologic:  Normal speech and language. No gross focal neurologic deficits are appreciated.  Skin:  Skin is warm, dry and intact. No rash noted.   ____________________________________________   LABS (all labs ordered are listed, but only abnormal results are displayed)  Labs Reviewed  COMPREHENSIVE METABOLIC PANEL - Abnormal; Notable for the following components:      Result Value   Glucose, Bld 106 (*)    Total Bilirubin 3.2 (*)    All other components within normal limits  CBC - Abnormal; Notable for the following components:   WBC 15.4 (*)    All other components within normal limits  URINALYSIS, COMPLETE (UACMP) WITH MICROSCOPIC - Abnormal; Notable for the following components:   Color, Urine YELLOW (*)    APPearance CLOUDY (*)    Specific Gravity, Urine 1.031 (*)    Hgb urine dipstick MODERATE (*)    Ketones, ur 80 (*)    Protein, ur 100 (*)    Leukocytes,Ua LARGE (*)    Bacteria, UA RARE (*)    All other components within normal limits  URINE CULTURE  LIPASE, BLOOD  POC URINE PREG, ED   ____________________________________________  EKG   ____________________________________________  RADIOLOGY , personally viewed and evaluated these images (plain radiographs) as part of my medical decision making, as well as reviewing the written report by the radiologist.  ED MD interpretation:   Official radiology report(s): CT Renal Stone  Study  Result Date: 11/28/2020 CLINICAL DATA:  Patient c/o abd pain and emesis X3 days EXAM: CT ABDOMEN AND PELVIS WITHOUT CONTRAST TECHNIQUE: Multidetector CT imaging of the abdomen and pelvis was performed following the standard protocol without IV contrast. COMPARISON:  CT abdomen pelvis 09/07/2020 FINDINGS: Lower chest: No acute abnormality. Limited evaluation of the abdominal viscera given lack of IV contrast. Hepatobiliary: No focal liver abnormality is seen. Gallbladder is unremarkable. Pancreas: No surrounding inflammatory changes. Spleen: Normal in size without focal abnormality. Adrenals/Urinary Tract: Adrenal glands are unremarkable. Kidneys are symmetric in size. No hydronephrosis or renal calculi identified. Urinary bladder is unremarkable. Stomach/Bowel: Stomach is within normal limits. Appendix appears normal. No evidence of bowel wall thickening, distention, or inflammatory changes. Vascular/Lymphatic: Vascular patency cannot be assessed in the absence of IV contrast. No enlarged abdominal or pelvic lymph nodes. Reproductive: Uterus and bilateral adnexa are unremarkable. Nonspecific mild free fluid in the pelvis, similar to prior. Other: No abdominal  wall hernia or abnormality. Musculoskeletal: No acute or significant osseous findings. IMPRESSION: 1. No evidence of acute intra-abdominal pathology on a noncontrast exam. No renal calculi identified. 2. Nonspecific mild free fluid in the pelvis, similar to prior, which may be physiologic in a reproductive age female. Electronically Signed   By: Emmaline Kluver M.D.   On: 11/28/2020 17:38    ____________________________________________   PROCEDURES  Procedure(s) performed (including Critical Care):  Procedures   ____________________________________________   INITIAL IMPRESSION / ASSESSMENT AND PLAN / ED COURSE  ----------------------------------------- 7:10 PM on 11/28/2020 -----------------------------------------  Patient's  pain is now gone and she is tolerating p.o.  Patient's white count is 15,000 but has been 15,000 for quite some time.  She has red and white cells in her urine but no dysuria urgency or frequency.  CT was negative.  She has no lower abdominal pain anymore either.  I will get the urine culture and wait for that to return before I give her any antibiotics.              ____________________________________________   FINAL CLINICAL IMPRESSION(S) / ED DIAGNOSES  Final diagnoses:  Non-intractable vomiting with nausea, unspecified vomiting type  Pain of upper abdomen     ED Discharge Orders         Ordered    ondansetron (ZOFRAN ODT) 4 MG disintegrating tablet  Every 8 hours PRN        11/28/20 1908    promethazine (PHENERGAN) 12.5 MG tablet  Every 6 hours PRN        11/28/20 1908          *Please note:  Taylor Miller was evaluated in Emergency Department on 11/28/2020 for the symptoms described in the history of present illness. She was evaluated in the context of the global COVID-19 pandemic, which necessitated consideration that the patient might be at risk for infection with the SARS-CoV-2 virus that causes COVID-19. Institutional protocols and algorithms that pertain to the evaluation of patients at risk for COVID-19 are in a state of rapid change based on information released by regulatory bodies including the CDC and federal and state organizations. These policies and algorithms were followed during the patient's care in the ED.  Some ED evaluations and interventions may be delayed as a result of limited staffing during and the pandemic.*   Note:  This document was prepared using Dragon voice recognition software and may include unintentional dictation errors.    Arnaldo Natal, MD 11/28/20 1910

## 2020-11-29 LAB — SARS CORONAVIRUS 2 (TAT 6-24 HRS): SARS Coronavirus 2: NEGATIVE

## 2020-11-30 LAB — URINE CULTURE

## 2022-06-14 ENCOUNTER — Encounter: Payer: Self-pay | Admitting: Emergency Medicine

## 2022-06-14 ENCOUNTER — Emergency Department
Admission: EM | Admit: 2022-06-14 | Discharge: 2022-06-14 | Disposition: A | Discharge status: Home / Self Care | Payer: Medicaid Other | Attending: Emergency Medicine | Admitting: Emergency Medicine

## 2022-06-14 ENCOUNTER — Other Ambulatory Visit: Payer: Self-pay

## 2022-06-14 DIAGNOSIS — O210 Mild hyperemesis gravidarum: Secondary | ICD-10-CM | POA: Diagnosis present

## 2022-06-14 DIAGNOSIS — O219 Vomiting of pregnancy, unspecified: Secondary | ICD-10-CM | POA: Diagnosis not present

## 2022-06-14 DIAGNOSIS — Z3A01 Less than 8 weeks gestation of pregnancy: Secondary | ICD-10-CM | POA: Diagnosis not present

## 2022-06-14 LAB — URINALYSIS, ROUTINE W REFLEX MICROSCOPIC
Bilirubin Urine: NEGATIVE
Glucose, UA: NEGATIVE mg/dL
Hgb urine dipstick: NEGATIVE
Ketones, ur: 20 mg/dL — AB
Nitrite: NEGATIVE
Protein, ur: 100 mg/dL — AB
Specific Gravity, Urine: 1.028 (ref 1.005–1.030)
pH: 7 (ref 5.0–8.0)

## 2022-06-14 LAB — CBC WITH DIFFERENTIAL/PLATELET
Abs Immature Granulocytes: 0.06 10*3/uL (ref 0.00–0.07)
Basophils Absolute: 0.1 10*3/uL (ref 0.0–0.1)
Basophils Relative: 0 %
Eosinophils Absolute: 0.1 10*3/uL (ref 0.0–0.5)
Eosinophils Relative: 1 %
HCT: 39.9 % (ref 36.0–46.0)
Hemoglobin: 13.2 g/dL (ref 12.0–15.0)
Immature Granulocytes: 0 %
Lymphocytes Relative: 17 %
Lymphs Abs: 2.5 10*3/uL (ref 0.7–4.0)
MCH: 28.4 pg (ref 26.0–34.0)
MCHC: 33.1 g/dL (ref 30.0–36.0)
MCV: 85.8 fL (ref 80.0–100.0)
Monocytes Absolute: 1 10*3/uL (ref 0.1–1.0)
Monocytes Relative: 7 %
Neutro Abs: 11.6 10*3/uL — ABNORMAL HIGH (ref 1.7–7.7)
Neutrophils Relative %: 75 %
Platelets: 366 10*3/uL (ref 150–400)
RBC: 4.65 MIL/uL (ref 3.87–5.11)
RDW: 13.1 % (ref 11.5–15.5)
WBC: 15.3 10*3/uL — ABNORMAL HIGH (ref 4.0–10.5)
nRBC: 0 % (ref 0.0–0.2)

## 2022-06-14 LAB — BASIC METABOLIC PANEL
Anion gap: 7 (ref 5–15)
BUN: 13 mg/dL (ref 6–20)
CO2: 24 mmol/L (ref 22–32)
Calcium: 9.4 mg/dL (ref 8.9–10.3)
Chloride: 105 mmol/L (ref 98–111)
Creatinine, Ser: 0.74 mg/dL (ref 0.44–1.00)
GFR, Estimated: >60 mL/min (ref >60)
Glucose, Bld: 97 mg/dL (ref 70–99)
Potassium: 3.9 mmol/L (ref 3.5–5.1)
Sodium: 136 mmol/L (ref 135–145)

## 2022-06-14 LAB — HCG, QUANTITATIVE, PREGNANCY: hCG, Beta Chain, Quant, S: 41442 m[IU]/mL — ABNORMAL HIGH (ref <5)

## 2022-06-14 MED ORDER — SODIUM CHLORIDE 0.9 % IV BOLUS
1000.0000 mL | Freq: Once | INTRAVENOUS | Status: AC
  Administered 2022-06-14: 1000 mL via INTRAVENOUS

## 2022-06-14 MED ORDER — METOCLOPRAMIDE HCL 5 MG/ML IJ SOLN
10.0000 mg | Freq: Once | INTRAMUSCULAR | Status: AC
  Administered 2022-06-14: 10 mg via INTRAVENOUS
  Filled 2022-06-14: qty 2

## 2022-06-14 MED ORDER — ONDANSETRON 4 MG PO TBDP: 4.0000 mg | ORAL_TABLET | Freq: Three times a day (TID) | ORAL | 0 refills | Status: DC | PRN

## 2022-06-14 MED ORDER — METOCLOPRAMIDE HCL 10 MG PO TABS: 10.0000 mg | ORAL_TABLET | Freq: Three times a day (TID) | ORAL | 0 refills | Status: DC | PRN

## 2022-06-14 NOTE — ED Triage Notes (Signed)
Patient c/o emesis over the past 3 days states has not been able to keep anything down. Patient is [redacted] weeks pregnant.

## 2022-06-14 NOTE — ED Provider Notes (Signed)
[facility] Provider Note    Event Date/Time   First MD Initiated Contact with Patient 06/14/22 1300     (approximate)  History   Chief Complaint: Hyperemesis Gravidarum  HPI  Taylor Miller is a 26 y.o. female G2 P1 approximately [redacted] weeks pregnant who presents to the emergency department for nausea vomiting.  According to the patient throughout these last couple weeks patient has been very nauseated and over the past 3 days states she has not been able to keep down food and very little fluids.  Patient states she had very similar significant nausea vomiting with her first pregnancy as well.  Denies any abdominal pain no vaginal bleeding or discharge.  Physical Exam   Triage Vital Signs: ED Triage Vitals  Enc Vitals Group     BP 06/14/22 1218 (!) 107/47     Pulse Rate 06/14/22 1218 65     Resp 06/14/22 1218 17     Temp 06/14/22 1218 98.7 F (37.1 C)     Temp Source 06/14/22 1218 Oral     SpO2 06/14/22 1218 97 %     Weight 06/14/22 1219 153 lb (69.4 kg)     Height 06/14/22 1219 5\' 2"  (1.575 m)     Head Circumference --      Peak Flow --      Pain Score 06/14/22 1230 0     Pain Loc --      Pain Edu? --      Excl. in GC? --     Most recent vital signs: Vitals:   06/14/22 1218  BP: (!) 107/47  Pulse: 65  Resp: 17  Temp: 98.7 F (37.1 C)  SpO2: 97%    General: Awake, no distress.  CV:  Good peripheral perfusion.  Regular rate and rhythm  Resp:  Normal effort.  Equal breath sounds bilaterally.  Abd:  No distention.  Soft, nontender.  No rebound or guarding.   ED Results / Procedures / Treatments   MEDICATIONS ORDERED IN ED: Medications - No data to display   IMPRESSION / MDM / ASSESSMENT AND PLAN / ED COURSE  I reviewed the triage vital signs and the nursing notes.  Patient's presentation is most consistent with acute presentation with potential threat to life or bodily function.  Patient presents emergency department for nausea  and vomiting approximately [redacted] weeks pregnant.  Patient has a history of hyperemesis gravidarum with her first pregnancy.  We will check labs, obtain a urine sample and a beta-hCG as well.  I spoke to the patient regarding the different medication options and risks.  We will dose IV Reglan as well as normal saline to hydrate the patient.  As long as the patient is able to tolerate p.o. in the emergency department we will likely discharge with Reglan as well as a backup oral dissolving tablet of Zofran to be used if needed.  Patient agreeable to plan of care.  Patient will follow-up with OB once scheduled.  Patient CBC shows mild leukocytosis otherwise normal, chemistry is reassuring including renal function.  Patient states she is feeling much better after nausea medication.  We will discharge with Reglan as well as ODT Zofran.  Patient will follow-up with OB.  Patient agreeable to plan of care.  FINAL CLINICAL IMPRESSION(S) / ED DIAGNOSES   Morning sickness  Rx / DC Orders   Reglan Zofran ODT  Note:  This document was prepared using Dragon voice recognition software and may include  unintentional dictation errors.   Minna Antis, MD 06/14/22 [phone]

## 2022-06-14 NOTE — ED Provider Triage Note (Signed)
Emergency Medicine Provider Triage Evaluation Note  Taylor Miller , a 26 y.o. female  was evaluated in triage.  Pt complains of vomiting x 2 1/2 days.  [redacted] weeks pregnant.    Review of Systems  Positive: Vomiting and nausea + , ? Fever & chills Negative: No diarrhea,   Physical Exam  There were no vitals taken for this visit. Gen:   Awake, no distress   Resp:  Normal effort  MSK:   Moves extremities without difficulty  Other:    Medical Decision Making  Medically screening exam initiated at 12:18 PM.  Appropriate orders placed.  Taylor Miller was informed that the remainder of the evaluation will be completed by another provider, this initial triage assessment does not replace that evaluation, and the importance of remaining in the ED until their evaluation is complete.     Tommi Rumps, PA-C 06/14/22 1222

## 2022-06-17 ENCOUNTER — Other Ambulatory Visit: Payer: Self-pay

## 2022-06-17 ENCOUNTER — Encounter: Payer: Self-pay | Admitting: Emergency Medicine

## 2022-06-17 ENCOUNTER — Emergency Department
Admission: EM | Admit: 2022-06-17 | Discharge: 2022-06-17 | Disposition: A | Discharge status: Home / Self Care | Payer: Medicaid Other | Attending: Emergency Medicine | Admitting: Emergency Medicine

## 2022-06-17 DIAGNOSIS — R197 Diarrhea, unspecified: Secondary | ICD-10-CM | POA: Diagnosis not present

## 2022-06-17 DIAGNOSIS — Z3A01 Less than 8 weeks gestation of pregnancy: Secondary | ICD-10-CM | POA: Insufficient documentation

## 2022-06-17 DIAGNOSIS — E876 Hypokalemia: Secondary | ICD-10-CM | POA: Insufficient documentation

## 2022-06-17 DIAGNOSIS — O2341 Unspecified infection of urinary tract in pregnancy, first trimester: Secondary | ICD-10-CM | POA: Diagnosis not present

## 2022-06-17 DIAGNOSIS — D72829 Elevated white blood cell count, unspecified: Secondary | ICD-10-CM | POA: Diagnosis not present

## 2022-06-17 DIAGNOSIS — O99111 Other diseases of the blood and blood-forming organs and certain disorders involving the immune mechanism complicating pregnancy, first trimester: Secondary | ICD-10-CM | POA: Insufficient documentation

## 2022-06-17 DIAGNOSIS — N390 Urinary tract infection, site not specified: Secondary | ICD-10-CM

## 2022-06-17 DIAGNOSIS — O99281 Endocrine, nutritional and metabolic diseases complicating pregnancy, first trimester: Secondary | ICD-10-CM | POA: Diagnosis not present

## 2022-06-17 DIAGNOSIS — O26891 Other specified pregnancy related conditions, first trimester: Secondary | ICD-10-CM | POA: Diagnosis present

## 2022-06-17 LAB — CBC
HCT: 38.1 % (ref 36.0–46.0)
Hemoglobin: 13 g/dL (ref 12.0–15.0)
MCH: 28.6 pg (ref 26.0–34.0)
MCHC: 34.1 g/dL (ref 30.0–36.0)
MCV: 83.9 fL (ref 80.0–100.0)
Platelets: 384 10*3/uL (ref 150–400)
RBC: 4.54 MIL/uL (ref 3.87–5.11)
RDW: 12.5 % (ref 11.5–15.5)
WBC: 18.2 10*3/uL — ABNORMAL HIGH (ref 4.0–10.5)
nRBC: 0 % (ref 0.0–0.2)

## 2022-06-17 LAB — URINALYSIS, ROUTINE W REFLEX MICROSCOPIC
Bilirubin Urine: NEGATIVE
Glucose, UA: NEGATIVE mg/dL
Ketones, ur: 20 mg/dL — AB
Nitrite: NEGATIVE
Protein, ur: 100 mg/dL — AB
Specific Gravity, Urine: 1.027 (ref 1.005–1.030)
pH: 5 (ref 5.0–8.0)

## 2022-06-17 LAB — LIPASE, BLOOD: Lipase: 27 U/L (ref 11–51)

## 2022-06-17 LAB — COMPREHENSIVE METABOLIC PANEL
ALT: 24 U/L (ref 0–44)
AST: 20 U/L (ref 15–41)
Albumin: 4.7 g/dL (ref 3.5–5.0)
Alkaline Phosphatase: 55 U/L (ref 38–126)
Anion gap: 8 (ref 5–15)
BUN: 12 mg/dL (ref 6–20)
CO2: 26 mmol/L (ref 22–32)
Calcium: 9.3 mg/dL (ref 8.9–10.3)
Chloride: 103 mmol/L (ref 98–111)
Creatinine, Ser: 0.77 mg/dL (ref 0.44–1.00)
GFR, Estimated: >60 mL/min (ref >60)
Glucose, Bld: 105 mg/dL — ABNORMAL HIGH (ref 70–99)
Potassium: 3.2 mmol/L — ABNORMAL LOW (ref 3.5–5.1)
Sodium: 137 mmol/L (ref 135–145)
Total Bilirubin: 3.7 mg/dL — ABNORMAL HIGH (ref 0.3–1.2)
Total Protein: 8 g/dL (ref 6.5–8.1)

## 2022-06-17 MED ORDER — SODIUM CHLORIDE 0.9 % IV BOLUS
1000.0000 mL | Freq: Once | INTRAVENOUS | Status: AC
Start: 2022-06-17 — End: 2022-06-17
  Administered 2022-06-17: 1000 mL via INTRAVENOUS

## 2022-06-17 MED ORDER — METOCLOPRAMIDE HCL 5 MG/ML IJ SOLN
10.0000 mg | Freq: Once | INTRAMUSCULAR | Status: AC
  Administered 2022-06-17: 10 mg via INTRAVENOUS
  Filled 2022-06-17: qty 2

## 2022-06-17 MED ORDER — ONDANSETRON 4 MG PO TBDP: 4.0000 mg | ORAL_TABLET | Freq: Three times a day (TID) | ORAL | 0 refills | Status: DC | PRN

## 2022-06-17 MED ORDER — CEPHALEXIN 500 MG PO CAPS: 500.0000 mg | ORAL_CAPSULE | Freq: Three times a day (TID) | ORAL | 0 refills | Status: AC

## 2022-06-17 MED ORDER — DOXYLAMINE-PYRIDOXINE 10-10 MG PO TBEC: 2.0000 | DELAYED_RELEASE_TABLET | Freq: Every day | ORAL | 0 refills | Status: AC

## 2022-06-17 NOTE — ED Triage Notes (Signed)
Patient to ED via POV for vomiting- [redacted] weeks pregnant. Seen here for same 3 days ago with no relief.

## 2022-06-17 NOTE — ED Notes (Signed)
26 yof with a c/c of vomiting for the past couple of days. The pt was just seen in the ER ref the same issue on 06/14/22. The pt is warm, pink, and dry. The pt is alert and oriented x 4.

## 2022-06-17 NOTE — ED Provider Notes (Signed)
[facility] Provider Note    Event Date/Time   First MD Initiated Contact with Patient 06/17/22 [phone]     (approximate)   History   Chief Complaint Emesis During Pregnancy   HPI Taylor Miller is a 26 y.o. female, G2, P1, approximately [redacted] weeks pregnant, presents emergency department for evaluation of emesis.  Patient states that she has been having persistent nausea and vomiting for the past week.  She was recently seen here on 06/14/2022 where she was prescribed ondansetron and metoclopramide, however she states that these medications have minimally helped.  Reports being unable to keep down any food or water.  Reports having some diarrhea over the past couple days as well, though attributes this to recent intakes of soup.  Denies abdominal pain, pelvic pain, vaginal bleeding, fever/chills, myalgias, chest pain, shortness of breath, rash/lesions, dizziness/lightheadedness, or urinary symptoms.  History Limitations: No limitations.        Physical Exam  Triage Vital Signs: ED Triage Vitals  Enc Vitals Group     BP 06/17/22 0851 117/76     Pulse Rate 06/17/22 0851 75     Resp 06/17/22 0851 18     Temp 06/17/22 0851 98.6 F (37 C)     Temp Source 06/17/22 0851 Oral     SpO2 06/17/22 0851 99 %     Weight 06/17/22 0852 144 lb (65.3 kg)     Height 06/17/22 0852 5\' 2"  (1.575 m)     Head Circumference --      Peak Flow --      Pain Score 06/17/22 0852 0     Pain Loc --      Pain Edu? --      Excl. in GC? --     Most recent vital signs: Vitals:   06/17/22 0851  BP: 117/76  Pulse: 75  Resp: 18  Temp: 98.6 F (37 C)  SpO2: 99%    General: Awake, NAD.  Skin: Warm, dry. No rashes or lesions.  Eyes: PERRL. Conjunctivae normal.  CV: Good peripheral perfusion.  Resp: Normal effort.  Abd: Soft, non-tender. No distention.  No CVA tenderness Neuro: At baseline. No gross neurological deficits.   Focused Exam: N/A.  Physical Exam    ED Results  / Procedures / Treatments  Labs (all labs ordered are listed, but only abnormal results are displayed) Labs Reviewed  COMPREHENSIVE METABOLIC PANEL - Abnormal; Notable for the following components:      Result Value   Potassium 3.2 (*)    Glucose, Bld 105 (*)    Total Bilirubin 3.7 (*)    All other components within normal limits  CBC - Abnormal; Notable for the following components:   WBC 18.2 (*)    All other components within normal limits  URINALYSIS, ROUTINE W REFLEX MICROSCOPIC - Abnormal; Notable for the following components:   Color, Urine AMBER (*)    APPearance CLOUDY (*)    Hgb urine dipstick SMALL (*)    Ketones, ur 20 (*)    Protein, ur 100 (*)    Leukocytes,Ua LARGE (*)    Bacteria, UA MANY (*)    All other components within normal limits  LIPASE, BLOOD     EKG N/A.   RADIOLOGY  ED Provider Interpretation: N/A.  No results found.  PROCEDURES:  Critical Care performed: N/A.  Procedures    MEDICATIONS ORDERED IN ED: Medications  sodium chloride 0.9 % bolus 1,000 mL (1,000 mLs Intravenous New Bag/Given  06/17/22 0917)  metoCLOPramide (REGLAN) injection 10 mg (10 mg Intravenous Given 06/17/22 0918)     IMPRESSION / MDM / ASSESSMENT AND PLAN / ED COURSE  I reviewed the triage vital signs and the nursing notes.                              Differential diagnosis includes, but is not limited to, morning sickness, hyperemesis gravidarum, gastroenteritis,  ED Course Patient appears well, vitals are within normal limits.  Afebrile.  CBC shows leukocytosis at 18.2.  No anemia present.  CMP shows mild hypokalemia 3.2, otherwise no significant electrolyte abnormalities or transaminitis.  Urinalysis notable for ketonuria, proteinuria, large leukocytes, many bacteria.  Highly suggestive of urinary tract infection.  Lipase unremarkable at 27.  Assessment/Plan Patient presents with nausea/vomiting in the setting of 6-week pregnancy.  She appears well  clinically, though does appear to have persistent nausea despite p.o. antiemetics at home..  Treated her here with IV fluids and metoclopramide with some relief in her symptoms.  Her urinalysis does show evidence of a urinary tract infection, which may be exacerbating her symptoms.  We will provide her with a prescription for cephalexin.  We will additionally refill her ondansetron and add vitamin B6/doxylamine to her antiemetic regimen.  Encouraged her to follow-up with her OB/GYN as well.  We will plan to discharge.  Considered admission for this patient, but given her stable presentation and access to follow-up, she is unlikely to benefit from admission.  Provided the patient with anticipatory guidance, return precautions, and educational material. Encouraged the patient to return to the emergency department at any time if they begin to experience any new or worsening symptoms. Patient expressed understanding and agreed with the plan.   Patient's presentation is most consistent with acute complicated illness / injury requiring diagnostic workup.       FINAL CLINICAL IMPRESSION(S) / ED DIAGNOSES   Final diagnoses:  Urinary tract infection without hematuria, site unspecified     Rx / DC Orders   ED Discharge Orders          Ordered    cephALEXin (KEFLEX) 500 MG capsule  3 times daily        06/17/22 0958    Doxylamine-Pyridoxine 10-10 MG TBEC  Daily at bedtime        06/17/22 0958    ondansetron (ZOFRAN-ODT) 4 MG disintegrating tablet  Every 8 hours PRN        06/17/22 1002             Note:  This document was prepared using Dragon voice recognition software and may include unintentional dictation errors.   Varney Daily, [place] 06/17/22 1004    Concha Se, MD 06/18/22 [phone]

## 2022-06-17 NOTE — Discharge Instructions (Addendum)
-  Take the doxylamine/pyridoxine as needed for nausea.  You may additionally continue to use metoclopramide or ondansetron as needed.  -Take the cephalexin for your urinary tract infection.  -Follow-up with your OB/GYN.  -Return to the emergency department anytime if you begin to experience any new or worsening symptoms.

## 2022-06-20 ENCOUNTER — Encounter: Payer: Self-pay | Admitting: Emergency Medicine

## 2022-06-20 ENCOUNTER — Emergency Department: Payer: Medicaid Other

## 2022-06-20 DIAGNOSIS — O2341 Unspecified infection of urinary tract in pregnancy, first trimester: Secondary | ICD-10-CM | POA: Insufficient documentation

## 2022-06-20 DIAGNOSIS — Z3A01 Less than 8 weeks gestation of pregnancy: Secondary | ICD-10-CM | POA: Diagnosis not present

## 2022-06-20 DIAGNOSIS — N9489 Other specified conditions associated with female genital organs and menstrual cycle: Secondary | ICD-10-CM | POA: Diagnosis not present

## 2022-06-20 DIAGNOSIS — O26891 Other specified pregnancy related conditions, first trimester: Secondary | ICD-10-CM | POA: Diagnosis present

## 2022-06-20 LAB — COMPREHENSIVE METABOLIC PANEL
ALT: 20 U/L (ref 0–44)
AST: 19 U/L (ref 15–41)
Albumin: 4.4 g/dL (ref 3.5–5.0)
Alkaline Phosphatase: 48 U/L (ref 38–126)
Anion gap: 7 (ref 5–15)
BUN: 9 mg/dL (ref 6–20)
CO2: 24 mmol/L (ref 22–32)
Calcium: 9.2 mg/dL (ref 8.9–10.3)
Chloride: 103 mmol/L (ref 98–111)
Creatinine, Ser: 0.74 mg/dL (ref 0.44–1.00)
GFR, Estimated: >60 mL/min (ref >60)
Glucose, Bld: 90 mg/dL (ref 70–99)
Potassium: 3.3 mmol/L — ABNORMAL LOW (ref 3.5–5.1)
Sodium: 134 mmol/L — ABNORMAL LOW (ref 135–145)
Total Bilirubin: 2.1 mg/dL — ABNORMAL HIGH (ref 0.3–1.2)
Total Protein: 7.5 g/dL (ref 6.5–8.1)

## 2022-06-20 LAB — URINALYSIS, ROUTINE W REFLEX MICROSCOPIC
Bacteria, UA: NONE SEEN
Bilirubin Urine: NEGATIVE
Glucose, UA: NEGATIVE mg/dL
Hgb urine dipstick: NEGATIVE
Ketones, ur: 80 mg/dL — AB
Nitrite: NEGATIVE
Protein, ur: 100 mg/dL — AB
Specific Gravity, Urine: 1.033 — ABNORMAL HIGH (ref 1.005–1.030)
pH: 5 (ref 5.0–8.0)

## 2022-06-20 LAB — CBC WITH DIFFERENTIAL/PLATELET
Abs Immature Granulocytes: 0.07 10*3/uL (ref 0.00–0.07)
Basophils Absolute: 0.1 10*3/uL (ref 0.0–0.1)
Basophils Relative: 0 %
Eosinophils Absolute: 0 10*3/uL (ref 0.0–0.5)
Eosinophils Relative: 0 %
HCT: 35.3 % — ABNORMAL LOW (ref 36.0–46.0)
Hemoglobin: 12.1 g/dL (ref 12.0–15.0)
Immature Granulocytes: 0 %
Lymphocytes Relative: 14 %
Lymphs Abs: 2.6 10*3/uL (ref 0.7–4.0)
MCH: 28.9 pg (ref 26.0–34.0)
MCHC: 34.3 g/dL (ref 30.0–36.0)
MCV: 84.4 fL (ref 80.0–100.0)
Monocytes Absolute: 1 10*3/uL (ref 0.1–1.0)
Monocytes Relative: 5 %
Neutro Abs: 14.8 10*3/uL — ABNORMAL HIGH (ref 1.7–7.7)
Neutrophils Relative %: 81 %
Platelets: 358 10*3/uL (ref 150–400)
RBC: 4.18 MIL/uL (ref 3.87–5.11)
RDW: 12.4 % (ref 11.5–15.5)
WBC: 18.6 10*3/uL — ABNORMAL HIGH (ref 4.0–10.5)
nRBC: 0 % (ref 0.0–0.2)

## 2022-06-20 LAB — HCG, QUANTITATIVE, PREGNANCY: hCG, Beta Chain, Quant, S: 104649 m[IU]/mL — ABNORMAL HIGH (ref <5)

## 2022-06-20 LAB — POC URINE PREG, ED: Preg Test, Ur: POSITIVE — AB

## 2022-06-20 NOTE — ED Triage Notes (Signed)
Pt presents via POV with complaints of vaginal pain that started today. Pt ~[redacted] weeks pregnant. Pt denies bleeding, syncope, or weakness.

## 2022-06-21 ENCOUNTER — Emergency Department
Admission: EM | Admit: 2022-06-21 | Discharge: 2022-06-21 | Disposition: A | Discharge status: Home / Self Care | Payer: Medicaid Other | Attending: Emergency Medicine | Admitting: Emergency Medicine

## 2022-06-21 DIAGNOSIS — O2341 Unspecified infection of urinary tract in pregnancy, first trimester: Secondary | ICD-10-CM

## 2022-06-21 DIAGNOSIS — O219 Vomiting of pregnancy, unspecified: Secondary | ICD-10-CM

## 2022-06-21 MED ORDER — METOCLOPRAMIDE HCL 10 MG PO TABS
10.0000 mg | ORAL_TABLET | Freq: Once | ORAL | Status: DC
  Filled 2022-06-21: qty 1

## 2022-06-21 MED ORDER — SODIUM CHLORIDE 0.9 % IV BOLUS (SEPSIS)
1000.0000 mL | Freq: Once | INTRAVENOUS | Status: AC
  Administered 2022-06-21: 1000 mL via INTRAVENOUS

## 2022-06-21 MED ORDER — SODIUM CHLORIDE 0.9 % IV SOLN
1.0000 g | Freq: Once | INTRAVENOUS | Status: AC
  Administered 2022-06-21: 1 g via INTRAVENOUS
  Filled 2022-06-21: qty 10

## 2022-06-21 MED ORDER — POTASSIUM CHLORIDE CRYS ER 20 MEQ PO TBCR
40.0000 meq | EXTENDED_RELEASE_TABLET | Freq: Once | ORAL | Status: AC
  Administered 2022-06-21: 40 meq via ORAL
  Filled 2022-06-21: qty 2

## 2022-06-21 MED ORDER — METOCLOPRAMIDE HCL 10 MG PO TABS: 10.0000 mg | ORAL_TABLET | Freq: Three times a day (TID) | ORAL | 0 refills | Status: DC | PRN

## 2022-06-21 MED ORDER — METOCLOPRAMIDE HCL 5 MG/ML IJ SOLN
10.0000 mg | Freq: Once | INTRAMUSCULAR | Status: AC
  Administered 2022-06-21: 10 mg via INTRAVENOUS
  Filled 2022-06-21: qty 2

## 2022-06-21 MED ORDER — ACETAMINOPHEN 500 MG PO TABS
1000.0000 mg | ORAL_TABLET | Freq: Once | ORAL | Status: AC
Start: 2022-06-21 — End: 2022-06-21
  Administered 2022-06-21: 1000 mg via ORAL
  Filled 2022-06-21: qty 2

## 2022-06-21 NOTE — Discharge Instructions (Addendum)
You may take over-the-counter Tylenol 1000 mg every 6 hours as needed for pain, fever.  Please continue the antibiotics that have already been prescribed for your UTI until complete.  I recommend you follow-up with your OB/GYN if pain continues.  Your ultrasound today appeared normal.  If you develop worsening pain, fever of 100.4 or higher, vomiting that does not stop, vaginal bleeding and you are soaking through more than a pad an hour for more than 2 straight hours, please return to the emergency department.

## 2022-06-21 NOTE — ED Provider Notes (Signed)
[facility] Provider Note    Event Date/Time   First MD Initiated Contact with Patient 06/21/22 [phone]     (approximate)   History   Vaginal Pain   HPI  Taylor Miller is a 26 y.o. female  G3P1 who presents to the emergency department left lower quadrant abdominal pain, nausea and vomiting.  Patient is currently pregnant with an LMP of 05/10/2022.  She denies any fever, diarrhea, abnormal vaginal discharge, vaginal bleeding.  She was recently diagnosed with a UTI and has been on Keflex.  She denies any concern for STIs.  No bloody stool or melena.  History provided by patient and friend.    Past Medical History:  Diagnosis Date   Medical history non-contributory     Past Surgical History:  Procedure Laterality Date   DILATION AND CURETTAGE OF UTERUS N/A 05/18/2016   Procedure: SUCTION DILATATION AND CURETTAGE;  Surgeon: Tilda Burrow, MD;  Location: [facility];  Service: Gynecology;  Laterality: N/A;    MEDICATIONS:  Prior to Admission medications   Medication Sig Start Date End Date Taking? Authorizing Provider  cephALEXin (KEFLEX) 500 MG capsule Take 1 capsule (500 mg total) by mouth 3 (three) times daily for 5 days. 06/17/22 06/22/22  Varney Daily, PA  Doxylamine-Pyridoxine 10-10 MG TBEC Take 2 tablets by mouth at bedtime. Start with 2 tablets daily at bedtime. Continue this for 3 days. If you continue to have morning sickness take 1 tablet in the morning, and 2 tablets every evening. 06/17/22 07/17/22  Varney Daily, PA  metoCLOPramide (REGLAN) 10 MG tablet Take 1 tablet (10 mg total) by mouth every 8 (eight) hours as needed for nausea. 06/14/22 06/14/23  Minna Antis, MD  ondansetron (ZOFRAN-ODT) 4 MG disintegrating tablet Take 1 tablet (4 mg total) by mouth every 8 (eight) hours as needed for up to 15 days for nausea or vomiting. 06/17/22 07/02/22  Varney Daily, PA    Physical Exam   Triage Vital Signs: ED Triage Vitals  Enc  Vitals Group     BP 06/20/22 2258 101/63     Pulse Rate 06/20/22 2258 88     Resp 06/20/22 2258 18     Temp 06/20/22 2258 98.4 F (36.9 C)     Temp Source 06/20/22 2258 Oral     SpO2 06/20/22 2258 99 %     Weight 06/20/22 2257 145 lb (65.8 kg)     Height 06/20/22 2257 5\' 2"  (1.575 m)     Head Circumference --      Peak Flow --      Pain Score 06/20/22 2257 7     Pain Loc --      Pain Edu? --      Excl. in GC? --     Most recent vital signs: Vitals:   06/20/22 2258 06/21/22 0340  BP: 101/63 116/61  Pulse: 88 70  Resp: 18 16  Temp: 98.4 F (36.9 C) 98.4 F (36.9 C)  SpO2: 99% 97%    CONSTITUTIONAL: Alert and oriented and responds appropriately to questions. Well-appearing; well-nourished HEAD: Normocephalic, atraumatic EYES: Conjunctivae clear, pupils appear equal, sclera nonicteric ENT: normal nose; moist mucous membranes NECK: Supple, normal ROM CARD: RRR; S1 and S2 appreciated; no murmurs, no clicks, no rubs, no gallops RESP: Normal chest excursion without splinting or tachypnea; breath sounds clear and equal bilaterally; no wheezes, no rhonchi, no rales, no hypoxia or respiratory distress, speaking full sentences ABD/GI: Normal bowel sounds;  non-distended; soft, mild tenderness in the left lower quadrant and suprapubic region but no tenderness in the right lower quadrant at McBurney's point BACK: The back appears normal EXT: Normal ROM in all joints; no deformity noted, no edema; no cyanosis SKIN: Normal color for age and race; warm; no rash on exposed skin NEURO: Moves all extremities equally, normal speech PSYCH: The patient's mood and manner are appropriate.   ED Results / Procedures / Treatments   LABS: (all labs ordered are listed, but only abnormal results are displayed) Labs Reviewed  CBC WITH DIFFERENTIAL/PLATELET - Abnormal; Notable for the following components:      Result Value   WBC 18.6 (*)    HCT 35.3 (*)    Neutro Abs 14.8 (*)    All other  components within normal limits  COMPREHENSIVE METABOLIC PANEL - Abnormal; Notable for the following components:   Sodium 134 (*)    Potassium 3.3 (*)    Total Bilirubin 2.1 (*)    All other components within normal limits  HCG, QUANTITATIVE, PREGNANCY - Abnormal; Notable for the following components:   hCG, Beta Chain, Quant, S 809,983 (*)    All other components within normal limits  URINALYSIS, ROUTINE W REFLEX MICROSCOPIC - Abnormal; Notable for the following components:   Color, Urine AMBER (*)    APPearance CLOUDY (*)    Specific Gravity, Urine 1.033 (*)    Ketones, ur 80 (*)    Protein, ur 100 (*)    Leukocytes,Ua LARGE (*)    All other components within normal limits  POC URINE PREG, ED - Abnormal; Notable for the following components:   Preg Test, Ur POSITIVE (*)    All other components within normal limits  URINE CULTURE     EKG:   RADIOLOGY: My personal review and interpretation of imaging: OB ultrasound shows single live intrauterine pregnancy measuring 6 weeks and 5 days without any other acute abnormality.  I have personally reviewed all radiology reports.   US OB LESS THAN 14 WEEKS WITH OB TRANSVAGINAL  Result Date: 06/21/2022 CLINICAL DATA:  Pregnant patient with lower abdominal and vaginal pain for 1 day. EXAM: OBSTETRIC <14 WK [place] AND TRANSVAGINAL OB US TECHNIQUE: Both transabdominal and transvaginal ultrasound examinations were performed for complete evaluation of the gestation as well as the maternal uterus, adnexal regions, and pelvic cul-de-sac. Transvaginal technique was performed to assess early pregnancy. COMPARISON:  None Available. FINDINGS: Intrauterine gestational sac: Single Yolk sac:  Visualized. Embryo:  Visualized. Cardiac Activity: Visualized. Heart Rate: 130 bpm CRL:  8.2 mm   6 w   5 d                  [place] EDC: 02/09/2023 Subchorionic hemorrhage:  None visualized. Maternal uterus/adnexae: The uterus is retroverted. Single live intrauterine pregnancy.  No subchorionic hemorrhage. The right ovary is normal. There is a 2.4 cm corpus luteal cyst in the left ovary. There is also a 1.9 cm simple cyst in the left ovary. Small amount of free fluid in the pelvis. This appears simple. IMPRESSION: 1. Single live intrauterine pregnancy estimated gestational age [redacted] weeks 5 days based on crown-rump length for ultrasound [facility] 02/09/2023. 2. No subchorionic hemorrhage. Electronically Signed   By: Narda Rutherford M.D.   On: 06/21/2022 01:14     PROCEDURES:  Critical Care performed: No     Procedures    IMPRESSION / MDM / ASSESSMENT AND PLAN / ED COURSE  I reviewed the triage vital signs  and the nursing notes.    Patient here with left lower quadrant pain and vomiting in pregnancy.    DIFFERENTIAL DIAGNOSIS (includes but not limited to):   UTI, torsion, ovarian cyst, ectopic, miscarriage, pyelonephritis, kidney stone, diverticulitis, less likely appendicitis.  Low suspicion for PID given pain just started and patient is approximately [redacted] weeks pregnant.   Patient's presentation is most consistent with acute presentation with potential threat to life or bodily function.   PLAN: We will obtain CBC, CMP, urinalysis, hCG, transvaginal ultrasound.  Will give Tylenol, Reglan for symptomatic relief.  Patient was seen here in the emergency department on 06/14/2022 for vomiting in pregnancy and was given Reglan and IV fluids and discharged with prescription of Reglan and Zofran.  Was seen again in the emergency department 06/17/2022 for the same and again improved after Reglan and IV fluids.  Was discharged on Keflex for UTI and Diglegis.     MEDICATIONS GIVEN IN ED: Medications  sodium chloride 0.9 % bolus 1,000 mL (has no administration in time range)  sodium chloride 0.9 % bolus 1,000 mL (1,000 mLs Intravenous New Bag/Given 06/21/22 0350)  cefTRIAXone (ROCEPHIN) 1 g in sodium chloride 0.9 % 100 mL IVPB (1 g Intravenous New Bag/Given 06/21/22 0350)  potassium  chloride SA (KLOR-CON M) CR tablet 40 mEq (has no administration in time range)  acetaminophen (TYLENOL) tablet 1,000 mg (1,000 mg Oral Given 06/21/22 0341)  metoCLOPramide (REGLAN) injection 10 mg (10 mg Intravenous Given 06/21/22 0351)     ED COURSE: Patient has a leukocytosis of 18.6 which is stable compared to 3 days ago.  Normal electrolytes other than slightly decreased potassium of 3.3.  Will give oral replacement.  Total bilirubin slightly elevated but this appears chronic for her and other LFTs are normal.  She is not having any upper abdominal pain today.  She does have large ketones in her urine.  Will encourage oral fluids after IV Reglan and IV fluids here today.  Urine shows shows large leukocyte esterase, red blood cells, white blood cells but no bacteria now and many squamous cells.  This could be a dirty catch.  Will send urine culture.  Unfortunately there is no urine culture back for her visit last week when she was discharged on Keflex.  hCG is rising appropriately and is 104,000 today.  OB ultrasound reviewed and interpreted by myself and radiologist and shows intrauterine pregnancy measuring 6 weeks and 5 days with normal fetal cardiac activity.  No other acute abnormality noted.  She denies any abnormal bleeding or discharge and declines STI screening.  Discussed with patient that we will hydrate her aggressively and give her antiemetics and Tylenol for symptomatic relief and see if she is able to tolerate eating and drinking.  We will also give her a dose of IV Rocephin for her UTI as I am worried that she is not able to keep down the Keflex that she has been prescribed.   4:37 AM  Pt tolerating p.o. and reports her crampy abdominal pain has now resolved.  Eating and drinking without difficulty and requesting discharge home.  Provided with another prescription for Reglan.  She still has Keflex at home.  She did receive Rocephin here.  Discussed supportive care instructions and return  precautions.  Will provide with work note.  CONSULTS: Admission considered but given patient reports feeling better and is tolerating p.o. and is requesting discharge home, will discharge.  She is appropriate for outpatient management.   OUTSIDE RECORDS REVIEWED:  Reviewed patient's last OB/GYN note in March 2019.       FINAL CLINICAL IMPRESSION(S) / ED DIAGNOSES   Final diagnoses:  Urinary tract infection in mother during first trimester of pregnancy  Nausea and vomiting in pregnancy     Rx / DC Orders   ED Discharge Orders          Ordered    metoCLOPramide (REGLAN) 10 MG tablet  Every 8 hours PRN        06/21/22 0327             Note:  This document was prepared using Dragon voice recognition software and may include unintentional dictation errors.   Reem Fleury, Layla Maw, DO 06/21/22 [phone]

## 2022-06-22 LAB — URINE CULTURE

## 2022-07-01 ENCOUNTER — Encounter: Payer: Self-pay | Admitting: Emergency Medicine

## 2022-07-01 ENCOUNTER — Emergency Department
Admission: EM | Admit: 2022-07-01 | Discharge: 2022-07-01 | Disposition: A | Discharge status: Home / Self Care | Payer: Medicaid Other | Attending: Emergency Medicine | Admitting: Emergency Medicine

## 2022-07-01 ENCOUNTER — Other Ambulatory Visit: Payer: Self-pay

## 2022-07-01 DIAGNOSIS — O210 Mild hyperemesis gravidarum: Secondary | ICD-10-CM | POA: Diagnosis not present

## 2022-07-01 DIAGNOSIS — Z3A01 Less than 8 weeks gestation of pregnancy: Secondary | ICD-10-CM | POA: Diagnosis not present

## 2022-07-01 DIAGNOSIS — O219 Vomiting of pregnancy, unspecified: Secondary | ICD-10-CM | POA: Diagnosis present

## 2022-07-01 LAB — CBC
HCT: 39.1 % (ref 36.0–46.0)
Hemoglobin: 13.4 g/dL (ref 12.0–15.0)
MCH: 28.4 pg (ref 26.0–34.0)
MCHC: 34.3 g/dL (ref 30.0–36.0)
MCV: 82.8 fL (ref 80.0–100.0)
Platelets: 415 10*3/uL — ABNORMAL HIGH (ref 150–400)
RBC: 4.72 MIL/uL (ref 3.87–5.11)
RDW: 12.1 % (ref 11.5–15.5)
WBC: 12.9 10*3/uL — ABNORMAL HIGH (ref 4.0–10.5)
nRBC: 0 % (ref 0.0–0.2)

## 2022-07-01 LAB — COMPREHENSIVE METABOLIC PANEL
ALT: 16 U/L (ref 0–44)
AST: 19 U/L (ref 15–41)
Albumin: 4.6 g/dL (ref 3.5–5.0)
Alkaline Phosphatase: 45 U/L (ref 38–126)
Anion gap: 11 (ref 5–15)
BUN: 12 mg/dL (ref 6–20)
CO2: 26 mmol/L (ref 22–32)
Calcium: 9.7 mg/dL (ref 8.9–10.3)
Chloride: 99 mmol/L (ref 98–111)
Creatinine, Ser: 0.78 mg/dL (ref 0.44–1.00)
GFR, Estimated: >60 mL/min (ref >60)
Glucose, Bld: 96 mg/dL (ref 70–99)
Potassium: 3.6 mmol/L (ref 3.5–5.1)
Sodium: 136 mmol/L (ref 135–145)
Total Bilirubin: 3.1 mg/dL — ABNORMAL HIGH (ref 0.3–1.2)
Total Protein: 8.1 g/dL (ref 6.5–8.1)

## 2022-07-01 LAB — URINALYSIS, ROUTINE W REFLEX MICROSCOPIC
Bilirubin Urine: NEGATIVE
Glucose, UA: NEGATIVE mg/dL
Ketones, ur: 80 mg/dL — AB
Nitrite: NEGATIVE
Protein, ur: 30 mg/dL — AB
Specific Gravity, Urine: 1.028 (ref 1.005–1.030)
WBC, UA: >50 WBC/hpf — ABNORMAL HIGH (ref 0–5)
pH: 5 (ref 5.0–8.0)

## 2022-07-01 LAB — LIPASE, BLOOD: Lipase: 21 U/L (ref 11–51)

## 2022-07-01 MED ORDER — SODIUM CHLORIDE 0.9 % IV BOLUS
1000.0000 mL | Freq: Once | INTRAVENOUS | Status: AC
  Administered 2022-07-01: 1000 mL via INTRAVENOUS

## 2022-07-01 MED ORDER — ONDANSETRON 4 MG PO TBDP
4.0000 mg | ORAL_TABLET | Freq: Three times a day (TID) | ORAL | 0 refills | Status: DC | PRN
Start: 2022-07-01 — End: 2023-10-08

## 2022-07-01 MED ORDER — METOCLOPRAMIDE HCL 5 MG/ML IJ SOLN
10.0000 mg | Freq: Once | INTRAMUSCULAR | Status: AC
  Administered 2022-07-01: 10 mg via INTRAVENOUS
  Filled 2022-07-01: qty 2

## 2022-07-01 NOTE — ED Provider Notes (Addendum)
[facility] Provider Note    Event Date/Time   First MD Initiated Contact with Patient 07/01/22 1350     (approximate)   History   Morning Sickness   HPI  Taylor Miller is a 26 y.o. female G3P1 with LMP of 05/10/2022 who presents with persistent nausea and vomiting over the last few weeks.  The patient states she was last seen in the ED 10 days ago with a similar presentation.  She is taking Reglan and Zofran at home with minimal relief in the last few days.  The patient states she is constantly nauseous.  She is sometimes able to hold down small sips of water but otherwise has not been able to hold anything down and is not able to eat.  She states she has not had a bowel movement in approximately last week.  She denies any abdominal pain but states sometimes when she is vomiting she has left flank pain.  She has no fever or chills.  She denies any vaginal bleeding or discharge.      Physical Exam   Triage Vital Signs: ED Triage Vitals  Enc Vitals Group     BP 07/01/22 1304 121/85     Pulse Rate 07/01/22 1304 77     Resp 07/01/22 1304 16     Temp 07/01/22 1304 98.3 F (36.8 C)     Temp Source 07/01/22 1304 Oral     SpO2 07/01/22 1304 99 %     Weight 07/01/22 1304 144 lb 15.9 oz (65.8 kg)     Height 07/01/22 1327 5\' 2"  (1.575 m)     Head Circumference --      Peak Flow --      Pain Score 07/01/22 1304 0     Pain Loc --      Pain Edu? --      Excl. in GC? --     Most recent vital signs: Vitals:   07/01/22 1304  BP: 121/85  Pulse: 77  Resp: 16  Temp: 98.3 F (36.8 C)  SpO2: 99%     General: Awake, no distress.  CV:  Good peripheral perfusion.  Resp:  Normal effort.  Abd:  Soft and nontender.  No distention.  Other:  No CVA or midline spinal tenderness.   ED Results / Procedures / Treatments   Labs (all labs ordered are listed, but only abnormal results are displayed) Labs Reviewed  COMPREHENSIVE METABOLIC PANEL - Abnormal;  Notable for the following components:      Result Value   Total Bilirubin 3.1 (*)    All other components within normal limits  CBC - Abnormal; Notable for the following components:   WBC 12.9 (*)    Platelets 415 (*)    All other components within normal limits  URINALYSIS, ROUTINE W REFLEX MICROSCOPIC - Abnormal; Notable for the following components:   Color, Urine AMBER (*)    APPearance CLOUDY (*)    Hgb urine dipstick SMALL (*)    Ketones, ur 80 (*)    Protein, ur 30 (*)    Leukocytes,Ua LARGE (*)    WBC, UA >50 (*)    Bacteria, UA RARE (*)    All other components within normal limits  LIPASE, BLOOD     EKG     RADIOLOGY    PROCEDURES:  Critical Care performed: No  Procedures   MEDICATIONS ORDERED IN ED: Medications  sodium chloride 0.9 % bolus 1,000 mL (0 mLs Intravenous  Stopped 07/01/22 1640)  metoCLOPramide (REGLAN) injection 10 mg (10 mg Intravenous Given 07/01/22 1543)     IMPRESSION / MDM / ASSESSMENT AND PLAN / ED COURSE  I reviewed the triage vital signs and the nursing notes.  26 year old female G3P1 with LMP of 6/27 presents with persistent nausea and vomiting.    I reviewed the past medical records.  The patient was seen in the ED here on 8/1, 8/4, and 8/8 with similar symptoms and was also treated for UTI.  I reviewed and interpreted the images from the patient's pelvic ultrasound on 8/7 which confirms IUP at 6 weeks 5 days at that time with visualized FHR.  On exam the patient is overall well-appearing and her vital signs are normal.  The abdomen is soft and nontender.  Differential diagnosis includes, but is not limited to, hyperemesis gravidarum, gastritis, gastroenteritis.  Patient's presentation is most consistent with acute complicated illness / injury requiring diagnostic workup.  We will obtain labs, urinalysis, give fluids, IV Reglan, and reassess.  ----------------------------------------- 4:40 PM on  07/01/2022 -----------------------------------------  Lab work-up is unremarkable.  The patient has mild leukocytosis but normal electrolytes and creatinine.  Lipase is normal.  Urinalysis is still pending.  However, the patient is now tolerating p.o.  She states she feels significantly better and wants to go home.  I feel that this is reasonable given her normal vital signs, reassuring labs, and ability to tolerate p.o.  I recommended that we wait for urinalysis since patient was recently treated for UTI but she does not want to wait for this result.  We will send the sample anyway.  I counseled the patient on the results of the work-up.  Return precautions given, and she expresses understanding.  ----------------------------------------- 6:45 PM on 07/01/2022 -----------------------------------------  Patient's urinalysis is back and shows significant WBCs but no nitrates and only rare bacteria.  This is similar to the patient's urinalyses from the last several ED visits.  Urine culture from 8/7 shows multiple species and is not consistent with UTI.  Patient already completed a course of Keflex that was prescribed on 8/4.  Clinically the patient does not have symptoms of UTI.  There is no indication for acute treatment at this time.   FINAL CLINICAL IMPRESSION(S) / ED DIAGNOSES   Final diagnoses:  Hyperemesis gravidarum     Rx / DC Orders   ED Discharge Orders          Ordered    ondansetron (ZOFRAN-ODT) 4 MG disintegrating tablet  Every 8 hours PRN        07/01/22 1640             Note:  This document was prepared using Dragon voice recognition software and may include unintentional dictation errors.    Dionne Bucy, MD 07/01/22 1647    Dionne Bucy, MD 07/01/22 [phone]

## 2022-07-01 NOTE — ED Triage Notes (Signed)
Says vomiting.  Meds are not working--zofran and reglan.  Says she feels dehydrated.

## 2023-10-08 ENCOUNTER — Encounter: Payer: Self-pay | Admitting: Emergency Medicine

## 2023-10-08 ENCOUNTER — Other Ambulatory Visit: Payer: Self-pay

## 2023-10-08 ENCOUNTER — Emergency Department
Admission: EM | Admit: 2023-10-08 | Discharge: 2023-10-08 | Disposition: A | Discharge status: Home / Self Care | Payer: Medicaid Other | Attending: Emergency Medicine | Admitting: Emergency Medicine

## 2023-10-08 DIAGNOSIS — B9689 Other specified bacterial agents as the cause of diseases classified elsewhere: Secondary | ICD-10-CM | POA: Insufficient documentation

## 2023-10-08 DIAGNOSIS — R112 Nausea with vomiting, unspecified: Secondary | ICD-10-CM | POA: Diagnosis present

## 2023-10-08 DIAGNOSIS — R197 Diarrhea, unspecified: Secondary | ICD-10-CM | POA: Insufficient documentation

## 2023-10-08 DIAGNOSIS — E860 Dehydration: Secondary | ICD-10-CM | POA: Insufficient documentation

## 2023-10-08 DIAGNOSIS — Z20822 Contact with and (suspected) exposure to covid-19: Secondary | ICD-10-CM | POA: Diagnosis not present

## 2023-10-08 DIAGNOSIS — N390 Urinary tract infection, site not specified: Secondary | ICD-10-CM | POA: Insufficient documentation

## 2023-10-08 LAB — COMPREHENSIVE METABOLIC PANEL
ALT: 31 U/L (ref 0–44)
AST: 26 U/L (ref 15–41)
Albumin: 4.8 g/dL (ref 3.5–5.0)
Alkaline Phosphatase: 52 U/L (ref 38–126)
Anion gap: 10 (ref 5–15)
BUN: 15 mg/dL (ref 6–20)
CO2: 24 mmol/L (ref 22–32)
Calcium: 9 mg/dL (ref 8.9–10.3)
Chloride: 105 mmol/L (ref 98–111)
Creatinine, Ser: 0.81 mg/dL (ref 0.44–1.00)
GFR, Estimated: >60 mL/min (ref >60)
Glucose, Bld: 118 mg/dL — ABNORMAL HIGH (ref 70–99)
Potassium: 3.9 mmol/L (ref 3.5–5.1)
Sodium: 139 mmol/L (ref 135–145)
Total Bilirubin: 1.7 mg/dL — ABNORMAL HIGH (ref <1.2)
Total Protein: 7.6 g/dL (ref 6.5–8.1)

## 2023-10-08 LAB — CBC
HCT: 38.2 % (ref 36.0–46.0)
Hemoglobin: 13 g/dL (ref 12.0–15.0)
MCH: 28.7 pg (ref 26.0–34.0)
MCHC: 34 g/dL (ref 30.0–36.0)
MCV: 84.3 fL (ref 80.0–100.0)
Platelets: 324 10*3/uL (ref 150–400)
RBC: 4.53 MIL/uL (ref 3.87–5.11)
RDW: 12.7 % (ref 11.5–15.5)
WBC: 12.8 10*3/uL — ABNORMAL HIGH (ref 4.0–10.5)
nRBC: 0 % (ref 0.0–0.2)

## 2023-10-08 LAB — URINALYSIS, ROUTINE W REFLEX MICROSCOPIC
Bilirubin Urine: NEGATIVE
Glucose, UA: NEGATIVE mg/dL
Ketones, ur: 80 mg/dL — AB
Nitrite: NEGATIVE
Protein, ur: 100 mg/dL — AB
Specific Gravity, Urine: 1.031 — ABNORMAL HIGH (ref 1.005–1.030)
pH: 6 (ref 5.0–8.0)

## 2023-10-08 LAB — LIPASE, BLOOD: Lipase: 20 U/L (ref 11–51)

## 2023-10-08 LAB — RESP PANEL BY RT-PCR (RSV, FLU A&B, COVID)  RVPGX2
Influenza A by PCR: NEGATIVE
Influenza B by PCR: NEGATIVE
Resp Syncytial Virus by PCR: NEGATIVE
SARS Coronavirus 2 by RT PCR: NEGATIVE

## 2023-10-08 LAB — PREGNANCY, URINE: Preg Test, Ur: NEGATIVE

## 2023-10-08 MED ORDER — SODIUM CHLORIDE 0.9 % IV BOLUS
1000.0000 mL | Freq: Once | INTRAVENOUS | Status: AC
  Administered 2023-10-08: 1000 mL via INTRAVENOUS

## 2023-10-08 MED ORDER — CEFDINIR 300 MG PO CAPS: 300.0000 mg | ORAL_CAPSULE | Freq: Two times a day (BID) | ORAL | 0 refills | Status: DC

## 2023-10-08 MED ORDER — SODIUM CHLORIDE 0.9 % IV SOLN
1.0000 g | Freq: Once | INTRAVENOUS | Status: AC
  Administered 2023-10-08: 1 g via INTRAVENOUS
  Filled 2023-10-08: qty 10

## 2023-10-08 MED ORDER — ONDANSETRON HCL 4 MG/2ML IJ SOLN
4.0000 mg | Freq: Once | INTRAMUSCULAR | Status: AC
Start: 2023-10-08 — End: 2023-10-08
  Administered 2023-10-08: 4 mg via INTRAVENOUS
  Filled 2023-10-08: qty 2

## 2023-10-08 MED ORDER — ONDANSETRON 4 MG PO TBDP: 4.0000 mg | ORAL_TABLET | Freq: Three times a day (TID) | ORAL | 0 refills | Status: DC | PRN

## 2023-10-08 MED ORDER — ONDANSETRON HCL 4 MG/2ML IJ SOLN
4.0000 mg | Freq: Once | INTRAMUSCULAR | Status: AC
  Administered 2023-10-08: 4 mg via INTRAVENOUS
  Filled 2023-10-08: qty 2

## 2023-10-08 NOTE — ED Notes (Signed)
See triage note  Presents with some n/v  States she developed sxs' this am   States she did go to a wedding and drank a lot of wine and did not eat anything  Denies any fever but is currently having chills

## 2023-10-08 NOTE — ED Triage Notes (Signed)
Pt reports yesterday started with NVD. Pt reports some pain in her abdomen and can not keep anything down. Pt denies recent exposures.

## 2023-10-08 NOTE — Discharge Instructions (Signed)
Follow-up with your primary care provider if any continued problems.  Increase fluids to stay hydrated.  A prescription for Zofran for nausea was sent to the pharmacy along with a 10-day course of cefdinir for your urinary tract infection.  Tylenol if needed for body aches.  Turn to the emergency department if any severe worsening of your symptoms.

## 2023-10-08 NOTE — ED Provider Notes (Signed)
[facility] Provider Note    Event Date/Time   First MD Initiated Contact with Patient 10/08/23 0840     (approximate)   History   Emesis, Abdominal Cramping, and Diarrhea   HPI  Taylor Miller is a 27 y.o. female   presents to the ED with complaint of onset yesterday of nausea, vomiting and diarrhea.  Patient last vomited approximately 10 minutes ago while in the ED and has been unable to keep liquids down.  Currently she complains of chills.  Patient also reports that she did not eat any food yesterday but did go to a wedding where she drank a lot of wine.      Physical Exam   Triage Vital Signs: ED Triage Vitals  Encounter Vitals Group     BP 10/08/23 0833 117/62     Systolic BP Percentile --      Diastolic BP Percentile --      Pulse Rate 10/08/23 0833 (!) 55     Resp 10/08/23 0833 16     Temp 10/08/23 0833 (!) 97.3 F (36.3 C)     Temp Source 10/08/23 0833 Oral     SpO2 10/08/23 0833 100 %     Weight 10/08/23 0804 145 lb 8.1 oz (66 kg)     Height 10/08/23 0804 5\' 2"  (1.575 m)     Head Circumference --      Peak Flow --      Pain Score 10/08/23 0804 9     Pain Loc --      Pain Education --      Exclude from Growth Chart --     Most recent vital signs: Vitals:   10/08/23 0833 10/08/23 1121  BP: 117/62 120/60  Pulse: (!) 55 60  Resp: 16 16  Temp: (!) 97.3 F (36.3 C)   SpO2: 100% 100%     General: Awake, no distress.  Nauseous, cooperative. CV:  Good peripheral perfusion.  Heart rate rate rhythm. Resp:  Normal effort.  Lungs are clear bilaterally. Abd:  No distention.  Soft, nontender, bowel sounds normoactive x 4 quadrants. Other:  No flank tenderness.   ED Results / Procedures / Treatments   Labs (all labs ordered are listed, but only abnormal results are displayed) Labs Reviewed  COMPREHENSIVE METABOLIC PANEL - Abnormal; Notable for the following components:      Result Value   Glucose, Bld 118 (*)    Total  Bilirubin 1.7 (*)    All other components within normal limits  CBC - Abnormal; Notable for the following components:   WBC 12.8 (*)    All other components within normal limits  URINALYSIS, ROUTINE W REFLEX MICROSCOPIC - Abnormal; Notable for the following components:   Color, Urine YELLOW (*)    APPearance CLOUDY (*)    Specific Gravity, Urine 1.031 (*)    Hgb urine dipstick LARGE (*)    Ketones, ur 80 (*)    Protein, ur 100 (*)    Leukocytes,Ua MODERATE (*)    Bacteria, UA RARE (*)    All other components within normal limits  RESP PANEL BY RT-PCR (RSV, FLU A&B, COVID)  [id number]  URINE CULTURE  LIPASE, BLOOD  PREGNANCY, URINE  POC URINE PREG, ED     PROCEDURES:  Critical Care performed:   Procedures   MEDICATIONS ORDERED IN ED: Medications  ondansetron (ZOFRAN) injection 4 mg (4 mg Intravenous Given 10/08/23 0912)  sodium chloride 0.9 % bolus 1,000 mL (  1,000 mLs Intravenous New Bag/Given 10/08/23 0927)  ondansetron (ZOFRAN) injection 4 mg (4 mg Intravenous Given 10/08/23 1158)  cefTRIAXone (ROCEPHIN) 1 g in sodium chloride 0.9 % 100 mL IVPB (1 g Intravenous New Bag/Given 10/08/23 1158)     IMPRESSION / MDM / ASSESSMENT AND PLAN / ED COURSE  I reviewed the triage vital signs and the nursing notes.   Differential diagnosis includes, but is not limited to, 27 year old female presents to the ED with complaint of chills and vomiting that began this morning.  Patient states she is unaware of any fever and no known sick contacts.  She does admit that she drank a lot of wine yesterday but did not eat.  Zofran was given in the emergency department which helped with the nausea and vomiting.  She was given 2 L of fluids.  Urinalysis shows a urinary tract infection and she was given Rocephin 1 g IV while in the emergency department.  Respiratory panel is negative and reassuring.  Prior to discharge patient was feeling better.  She is aware that she has antibiotics and Zofran that was  sent to the pharmacy of her choice and she is to follow-up with her PCP if any continued problems.  She is return to the emergency department if any severe worsening of her symptoms or not improving.     Patient's presentation is most consistent with acute illness / injury with system symptoms.  FINAL CLINICAL IMPRESSION(S) / ED DIAGNOSES   Final diagnoses:  Acute urinary tract infection  Nausea and vomiting in adult  Dehydration     Rx / DC Orders   ED Discharge Orders          Ordered    cefdinir (OMNICEF) 300 MG capsule  2 times daily        10/08/23 1241    ondansetron (ZOFRAN-ODT) 4 MG disintegrating tablet  Every 8 hours PRN        10/08/23 1241             Note:  This document was prepared using Dragon voice recognition software and may include unintentional dictation errors.   Tommi Rumps, PA-C 10/08/23 1259    Minna Antis, MD 10/08/23 1430

## 2023-12-03 ENCOUNTER — Encounter: Payer: Self-pay | Admitting: Emergency Medicine

## 2023-12-03 ENCOUNTER — Emergency Department: Payer: Medicaid Other

## 2023-12-03 ENCOUNTER — Other Ambulatory Visit: Payer: Self-pay

## 2023-12-03 ENCOUNTER — Emergency Department
Admission: EM | Admit: 2023-12-03 | Discharge: 2023-12-03 | Disposition: A | Discharge status: Home / Self Care | Payer: Medicaid Other | Attending: Emergency Medicine | Admitting: Emergency Medicine

## 2023-12-03 DIAGNOSIS — K529 Noninfective gastroenteritis and colitis, unspecified: Secondary | ICD-10-CM | POA: Diagnosis not present

## 2023-12-03 DIAGNOSIS — Z20822 Contact with and (suspected) exposure to covid-19: Secondary | ICD-10-CM | POA: Insufficient documentation

## 2023-12-03 DIAGNOSIS — R112 Nausea with vomiting, unspecified: Secondary | ICD-10-CM | POA: Diagnosis present

## 2023-12-03 LAB — COMPREHENSIVE METABOLIC PANEL
ALT: 28 U/L (ref 0–44)
AST: 37 U/L (ref 15–41)
Albumin: 4.8 g/dL (ref 3.5–5.0)
Alkaline Phosphatase: 53 U/L (ref 38–126)
Anion gap: 17 — ABNORMAL HIGH (ref 5–15)
BUN: 21 mg/dL — ABNORMAL HIGH (ref 6–20)
CO2: 18 mmol/L — ABNORMAL LOW (ref 22–32)
Calcium: 9.2 mg/dL (ref 8.9–10.3)
Chloride: 101 mmol/L (ref 98–111)
Creatinine, Ser: 1.15 mg/dL — ABNORMAL HIGH (ref 0.44–1.00)
GFR, Estimated: >60 mL/min (ref >60)
Glucose, Bld: 173 mg/dL — ABNORMAL HIGH (ref 70–99)
Potassium: 4.2 mmol/L (ref 3.5–5.1)
Sodium: 136 mmol/L (ref 135–145)
Total Bilirubin: 3.9 mg/dL — ABNORMAL HIGH (ref 0.0–1.2)
Total Protein: 8.5 g/dL — ABNORMAL HIGH (ref 6.5–8.1)

## 2023-12-03 LAB — CBC WITH DIFFERENTIAL/PLATELET
Abs Immature Granulocytes: 0.1 10*3/uL — ABNORMAL HIGH (ref 0.00–0.07)
Basophils Absolute: 0.1 10*3/uL (ref 0.0–0.1)
Basophils Relative: 0 %
Eosinophils Absolute: 0 10*3/uL (ref 0.0–0.5)
Eosinophils Relative: 0 %
HCT: 47.3 % — ABNORMAL HIGH (ref 36.0–46.0)
Hemoglobin: 15.8 g/dL — ABNORMAL HIGH (ref 12.0–15.0)
Immature Granulocytes: 1 %
Lymphocytes Relative: 4 %
Lymphs Abs: 0.8 10*3/uL (ref 0.7–4.0)
MCH: 28.6 pg (ref 26.0–34.0)
MCHC: 33.4 g/dL (ref 30.0–36.0)
MCV: 85.5 fL (ref 80.0–100.0)
Monocytes Absolute: 1.1 10*3/uL — ABNORMAL HIGH (ref 0.1–1.0)
Monocytes Relative: 5 %
Neutro Abs: 19.4 10*3/uL — ABNORMAL HIGH (ref 1.7–7.7)
Neutrophils Relative %: 90 %
Platelets: 450 10*3/uL — ABNORMAL HIGH (ref 150–400)
RBC: 5.53 MIL/uL — ABNORMAL HIGH (ref 3.87–5.11)
RDW: 13.3 % (ref 11.5–15.5)
WBC: 21.5 10*3/uL — ABNORMAL HIGH (ref 4.0–10.5)
nRBC: 0 % (ref 0.0–0.2)

## 2023-12-03 LAB — RESP PANEL BY RT-PCR (RSV, FLU A&B, COVID)  RVPGX2
Influenza A by PCR: NEGATIVE
Influenza B by PCR: NEGATIVE
Resp Syncytial Virus by PCR: NEGATIVE
SARS Coronavirus 2 by RT PCR: NEGATIVE

## 2023-12-03 LAB — HCG, QUANTITATIVE, PREGNANCY: hCG, Beta Chain, Quant, S: <1 m[IU]/mL (ref <5)

## 2023-12-03 LAB — MAGNESIUM: Magnesium: 1.9 mg/dL (ref 1.7–2.4)

## 2023-12-03 MED ORDER — ONDANSETRON 4 MG PO TBDP: 4.0000 mg | ORAL_TABLET | Freq: Three times a day (TID) | ORAL | 0 refills | Status: DC | PRN

## 2023-12-03 MED ORDER — SODIUM CHLORIDE 0.9 % IV BOLUS
1000.0000 mL | Freq: Once | INTRAVENOUS | Status: AC
Start: 2023-12-03 — End: 2023-12-03
  Administered 2023-12-03: 1000 mL via INTRAVENOUS

## 2023-12-03 MED ORDER — ONDANSETRON HCL 4 MG/2ML IJ SOLN
4.0000 mg | Freq: Once | INTRAMUSCULAR | Status: AC
  Administered 2023-12-03: 4 mg via INTRAVENOUS
  Filled 2023-12-03: qty 2

## 2023-12-03 MED ORDER — METOCLOPRAMIDE HCL 5 MG/ML IJ SOLN
10.0000 mg | Freq: Once | INTRAMUSCULAR | Status: AC
  Administered 2023-12-03: 10 mg via INTRAVENOUS
  Filled 2023-12-03: qty 2

## 2023-12-03 MED ORDER — ACETAMINOPHEN 500 MG PO TABS
1000.0000 mg | ORAL_TABLET | Freq: Once | ORAL | Status: AC
  Administered 2023-12-03: 1000 mg via ORAL
  Filled 2023-12-03: qty 2

## 2023-12-03 MED ORDER — MORPHINE SULFATE (PF) 4 MG/ML IV SOLN
4.0000 mg | Freq: Once | INTRAVENOUS | Status: AC
  Administered 2023-12-03: 4 mg via INTRAVENOUS
  Filled 2023-12-03: qty 1

## 2023-12-03 MED ORDER — IOHEXOL 300 MG/ML  SOLN
80.0000 mL | Freq: Once | INTRAMUSCULAR | Status: AC | PRN
  Administered 2023-12-03: 80 mL via INTRAVENOUS

## 2023-12-03 MED ORDER — SODIUM CHLORIDE 0.9 % IV BOLUS
1000.0000 mL | Freq: Once | INTRAVENOUS | Status: AC
  Administered 2023-12-03: 1000 mL via INTRAVENOUS

## 2023-12-03 NOTE — ED Triage Notes (Addendum)
Pt c/o vomiting and diarrhea since yesterday. Pt unable to keep down PO liquids. Pt denies any sick contacts. Pt reports bright red blood appearing in her stool after 3-4 episodes of diarrhea. Pt denies any medical hx. Pt took zofran ODT prior to arrival but threw up immediately after.

## 2023-12-03 NOTE — ED Provider Notes (Signed)
[facility] Provider Note    Event Date/Time   First MD Initiated Contact with Patient 12/03/23 [phone]     (approximate)  History   Chief Complaint: Emesis and Diarrhea  HPI  Taylor Miller is a 28 y.o. female with no significant past medical history presents to the emergency department for nausea vomiting diarrhea abdominal pain.  According to the patient since 5 PM yesterday she has been nauseated with frequent episodes of vomiting unable to keep down any food or fluids.  Patient has developed diarrhea now as well states that time she thought she saw some blood in the diarrhea.  No fever.  No urinary symptoms.  Patient is currently on her menstrual cycle.  No cough or congestion.  Physical Exam   Triage Vital Signs: ED Triage Vitals  Encounter Vitals Group     BP 12/03/23 0722 107/80     Systolic BP Percentile --      Diastolic BP Percentile --      Pulse Rate 12/03/23 0722 (!) 116     Resp 12/03/23 0722 20     Temp 12/03/23 0726 98.8 F (37.1 C)     Temp Source 12/03/23 0726 Axillary     SpO2 12/03/23 0722 99 %     Weight 12/03/23 0720 145 lb 4.8 oz (65.9 kg)     Height 12/03/23 0720 5\' 2"  (1.575 m)     Head Circumference --      Peak Flow --      Pain Score 12/03/23 0720 8     Pain Loc --      Pain Education --      Exclude from Growth Chart --     Most recent vital signs: Vitals:   12/03/23 0722 12/03/23 0726  BP: 107/80   Pulse: (!) 116   Resp: 20   Temp:  98.8 F (37.1 C)  SpO2: 99%     General: Awake, no distress.  CV:  Good peripheral perfusion.  Regular rate and rhythm  Resp:  Normal effort.  Equal breath sounds bilaterally.  Abd:  No distention.  Soft, mild to moderate diffuse tenderness more so in the epigastrium.  ED Results / Procedures / Treatments   RADIOLOGY  I have reviewed and interpreted CT images.  Patient has fluid-filled small bowel likely consistent with diarrheal illness. Radiology has read the CT scan is  largely negative, normal appendix.   MEDICATIONS ORDERED IN ED: Medications  sodium chloride 0.9 % bolus 1,000 mL (has no administration in time range)  ondansetron (ZOFRAN) injection 4 mg (has no administration in time range)  acetaminophen (TYLENOL) tablet 1,000 mg (has no administration in time range)  sodium chloride 0.9 % bolus 1,000 mL (has no administration in time range)     IMPRESSION / MDM / ASSESSMENT AND PLAN / ED COURSE  I reviewed the triage vital signs and the nursing notes.  Patient's presentation is most consistent with acute presentation with potential threat to life or bodily function.  Patient presents emergency department for diffuse abdominal pain nausea vomiting and diarrhea starting yesterday.  Patient's lab work today shows a moderate leukocytosis of 21,000 otherwise reassuring CBC.  Patient's chemistry shows signs of dehydration with elevated creatinine and anion gap mildly elevated blood glucose as well.  Will begin IV hydrate with 2 L of normal saline.  Magnesium is normal.  Potassium normal.  Respiratory panel pending.  Given the patient's diffuse tenderness on exam and leukocytosis we will  proceed with a CT scan abdomen/pelvis to further evaluate rule out any other complicating intra-abdominal pathology.  Given the patient's symptoms highly suspect more of a gastroenteritis complicated by dehydration.  We will treat nausea while awaiting further results.  Patient CT scan is negative, she is feeling better after fluids nausea and pain medication.  Highly suspect norovirus, she states her son just came down with symptoms this morning as well.  We will discharge with nausea medication discussed return precautions.  FINAL CLINICAL IMPRESSION(S) / ED DIAGNOSES   Gastroenteritis Abdominal pain    Note:  This document was prepared using Dragon voice recognition software and may include unintentional dictation errors.   Minna Antis, MD 12/03/23 1125

## 2023-12-04 LAB — BETA HCG QUANT (REF LAB): hCG Quant: <1 m[IU]/mL

## 2023-12-26 ENCOUNTER — Encounter: Payer: Self-pay | Admitting: Obstetrics & Gynecology

## 2024-06-02 ENCOUNTER — Other Ambulatory Visit: Payer: Self-pay

## 2024-06-02 ENCOUNTER — Emergency Department

## 2024-06-02 ENCOUNTER — Emergency Department
Admission: EM | Admit: 2024-06-02 | Discharge: 2024-06-02 | Disposition: A | Discharge status: Home / Self Care | Attending: Emergency Medicine | Admitting: Emergency Medicine

## 2024-06-02 DIAGNOSIS — R1012 Left upper quadrant pain: Secondary | ICD-10-CM | POA: Diagnosis not present

## 2024-06-02 DIAGNOSIS — Z3A01 Less than 8 weeks gestation of pregnancy: Secondary | ICD-10-CM | POA: Diagnosis not present

## 2024-06-02 DIAGNOSIS — O219 Vomiting of pregnancy, unspecified: Secondary | ICD-10-CM | POA: Insufficient documentation

## 2024-06-02 DIAGNOSIS — D72829 Elevated white blood cell count, unspecified: Secondary | ICD-10-CM | POA: Diagnosis not present

## 2024-06-02 LAB — URINALYSIS, ROUTINE W REFLEX MICROSCOPIC
Bilirubin Urine: NEGATIVE
Glucose, UA: NEGATIVE mg/dL
Hgb urine dipstick: NEGATIVE
Ketones, ur: 80 mg/dL — AB
Nitrite: NEGATIVE
Protein, ur: 100 mg/dL — AB
Specific Gravity, Urine: 1.031 — ABNORMAL HIGH (ref 1.005–1.030)
pH: 5 (ref 5.0–8.0)

## 2024-06-02 LAB — COMPREHENSIVE METABOLIC PANEL WITH GFR
ALT: 24 U/L (ref 0–44)
AST: 23 U/L (ref 15–41)
Albumin: 4.7 g/dL (ref 3.5–5.0)
Alkaline Phosphatase: 49 U/L (ref 38–126)
Anion gap: 13 (ref 5–15)
BUN: 19 mg/dL (ref 6–20)
CO2: 22 mmol/L (ref 22–32)
Calcium: 9.5 mg/dL (ref 8.9–10.3)
Chloride: 99 mmol/L (ref 98–111)
Creatinine, Ser: 0.77 mg/dL (ref 0.44–1.00)
GFR, Estimated: >60 mL/min (ref >60)
Glucose, Bld: 130 mg/dL — ABNORMAL HIGH (ref 70–99)
Potassium: 4.1 mmol/L (ref 3.5–5.1)
Sodium: 134 mmol/L — ABNORMAL LOW (ref 135–145)
Total Bilirubin: 4.2 mg/dL — ABNORMAL HIGH (ref 0.0–1.2)
Total Protein: 8.2 g/dL — ABNORMAL HIGH (ref 6.5–8.1)

## 2024-06-02 LAB — LIPASE, BLOOD: Lipase: 24 U/L (ref 11–51)

## 2024-06-02 LAB — HCG, QUANTITATIVE, PREGNANCY: hCG, Beta Chain, Quant, S: 21579 m[IU]/mL — ABNORMAL HIGH (ref <5)

## 2024-06-02 LAB — CBC
HCT: 38 % (ref 36.0–46.0)
Hemoglobin: 12.9 g/dL (ref 12.0–15.0)
MCH: 28.4 pg (ref 26.0–34.0)
MCHC: 33.9 g/dL (ref 30.0–36.0)
MCV: 83.7 fL (ref 80.0–100.0)
Platelets: 377 K/uL (ref 150–400)
RBC: 4.54 MIL/uL (ref 3.87–5.11)
RDW: 12.8 % (ref 11.5–15.5)
WBC: 18.1 K/uL — ABNORMAL HIGH (ref 4.0–10.5)
nRBC: 0 % (ref 0.0–0.2)

## 2024-06-02 LAB — POC URINE PREG, ED: Preg Test, Ur: POSITIVE — AB

## 2024-06-02 LAB — BILIRUBIN, DIRECT: Bilirubin, Direct: 0.3 mg/dL — ABNORMAL HIGH (ref 0.0–0.2)

## 2024-06-02 MED ORDER — PYRIDOXINE HCL 100 MG/ML IJ SOLN
30.0000 mg | Freq: Once | INTRAMUSCULAR | Status: AC
  Administered 2024-06-02: 30 mg via INTRAVENOUS
  Filled 2024-06-02: qty 0.3

## 2024-06-02 MED ORDER — SODIUM CHLORIDE 0.9 % IV SOLN
12.5000 mg | Freq: Four times a day (QID) | INTRAVENOUS | Status: DC | PRN
  Administered 2024-06-02: 12.5 mg via INTRAVENOUS
  Filled 2024-06-02: qty 12.5

## 2024-06-02 MED ORDER — LACTATED RINGERS IV BOLUS
1000.0000 mL | Freq: Once | INTRAVENOUS | Status: AC
  Administered 2024-06-02: 1000 mL via INTRAVENOUS

## 2024-06-02 MED ORDER — METOCLOPRAMIDE HCL 5 MG/ML IJ SOLN
10.0000 mg | Freq: Once | INTRAMUSCULAR | Status: AC
  Administered 2024-06-02: 10 mg via INTRAVENOUS
  Filled 2024-06-02: qty 2

## 2024-06-02 MED ORDER — SODIUM CHLORIDE 0.9 % IV BOLUS
1000.0000 mL | Freq: Once | INTRAVENOUS | Status: AC
  Administered 2024-06-02: 1000 mL via INTRAVENOUS

## 2024-06-02 MED ORDER — DOXYLAMINE SUCCINATE (SLEEP) 25 MG PO TABS
12.5000 mg | ORAL_TABLET | Freq: Once | ORAL | Status: AC
  Administered 2024-06-02: 12.5 mg via ORAL
  Filled 2024-06-02: qty 1

## 2024-06-02 MED ORDER — PROMETHAZINE HCL 12.5 MG PO TABS: 12.5000 mg | ORAL_TABLET | Freq: Four times a day (QID) | ORAL | 0 refills | Status: AC | PRN

## 2024-06-02 NOTE — ED Provider Notes (Signed)
 [facility] Provider Note    Event Date/Time   First MD Initiated Contact with Patient 06/02/24 1051     (approximate)   History   Emesis During Pregnancy   HPI  Taylor Miller is a 28 y.o. female G3, P2 presents for evaluation of vomiting during pregnancy.  Patient reports she is about [redacted] weeks pregnant and has been vomiting profusely for the past 2 days.  She tried taking some medication at home but has not been able to get relief.  She denies fevers, diarrhea, urinary symptoms, vaginal bleeding and lower abdominal pain.  She does report upper abdominal pain from the vomiting.      Physical Exam   Triage Vital Signs: ED Triage Vitals  Encounter Vitals Group     BP 06/02/24 1036 (!) 102/56     Girls Systolic BP Percentile --      Girls Diastolic BP Percentile --      Boys Systolic BP Percentile --      Boys Diastolic BP Percentile --      Pulse Rate 06/02/24 1036 68     Resp 06/02/24 1036 17     Temp 06/02/24 1036 97.7 F (36.5 C)     Temp Source 06/02/24 1036 Oral     SpO2 06/02/24 1036 99 %     Weight 06/02/24 1033 150 lb (68 kg)     Height 06/02/24 1033 5' 4 (1.626 m)     Head Circumference --      Peak Flow --      Pain Score 06/02/24 1034 9     Pain Loc --      Pain Education --      Exclude from Growth Chart --     Most recent vital signs: Vitals:   06/02/24 1036 06/02/24 1535  BP: (!) 102/56   Pulse: 68   Resp: 17   Temp: 97.7 F (36.5 C) 98.7 F (37.1 C)  SpO2: 99%    General: Awake, hunched over in bed, very uncomfortable CV:  Good peripheral perfusion. RRR. Resp:  Normal effort. CTAB. Abd:  No distention. Mild TTP in LUQ, otherwise non-tender. Other:     ED Results / Procedures / Treatments   Labs (all labs ordered are listed, but only abnormal results are displayed) Labs Reviewed  COMPREHENSIVE METABOLIC PANEL WITH GFR - Abnormal; Notable for the following components:      Result Value   Sodium 134 (*)     Glucose, Bld 130 (*)    Total Protein 8.2 (*)    Total Bilirubin 4.2 (*)    All other components within normal limits  CBC - Abnormal; Notable for the following components:   WBC 18.1 (*)    All other components within normal limits  HCG, QUANTITATIVE, PREGNANCY - Abnormal; Notable for the following components:   hCG, Beta Chain, Quant, S 21,579 (*)    All other components within normal limits  BILIRUBIN, DIRECT - Abnormal; Notable for the following components:   Bilirubin, Direct 0.3 (*)    All other components within normal limits  POC URINE PREG, ED - Abnormal; Notable for the following components:   Preg Test, Ur Positive (*)    All other components within normal limits  LIPASE, BLOOD  URINALYSIS, ROUTINE W REFLEX MICROSCOPIC    RADIOLOGY  OB and RUQ ultrasound obtained.  I interpreted the images as well as reviewed the radiologist report.  OB ultrasound as interpreted by me shows  a single intrauterine pregnancy measuring 5 weeks and 1 day. Heartbeat is not yet visualized.  Right upper quadrant ultrasound is pending.   PROCEDURES:  Critical Care performed: No  Procedures   MEDICATIONS ORDERED IN ED: Medications  promethazine  (PHENERGAN ) 12.5 mg in sodium chloride  0.9 % 50 mL IVPB (12.5 mg Intravenous New Bag/Given 06/02/24 1536)  sodium chloride  0.9 % bolus 1,000 mL (0 mLs Intravenous Stopped 06/02/24 1534)  pyridOXINE  (VITAMIN B6) injection 30 mg (30 mg Intravenous Given 06/02/24 1209)  doxylamine  (Sleep) (UNISOM ) tablet 12.5 mg (12.5 mg Oral Given 06/02/24 1208)     IMPRESSION / MDM / ASSESSMENT AND PLAN / ED COURSE  I reviewed the triage vital signs and the nursing notes.                             28 year old female presents for evaluation of vomiting during pregnancy.  Vital signs are stable the patient does look quite uncomfortable on exam.  Differential diagnosis includes, but is not limited to, dehydration, electrolyte abnormality, ectopic pregnancy,  hyperemesis gravidarum, biliary disease, pancreatitis, appendicitis, gastroenteritis.  Patient's presentation is most consistent with acute complicated illness / injury requiring diagnostic workup.  Will obtain basic labs, urinalysis and beta hCG.  Otherwise suspect patient's abdominal pain is secondary to vomiting, will obtain pelvic ultrasound to ensure intrauterine pregnancy and rule out ectopic.  Will also give patient some IV fluids and nausea medication and reassess for improvement.   Clinical Course as of 06/02/24 1541  Sun Jun 02, 2024  1133 Comprehensive metabolic panel(!) Notable for elevated bilirubin. Will obtain RUQ US  and direct bilirubin level. Patient does not have RUQ tenderness or positive murphy's sign.  [LD]  1133 CBC(!) Leukocytosis, otherwise unremarkable. [LD]  1134 Lipase, blood Within normal limits. [LD]  1505 Patient states she still feels dehydrated and has continued to vomit. Will give her another bag of fluids and more nausea medication. [LD]  1540 Care of the patient will be passed onto the oncoming provider who will make the final diagnosis and plan. [LD]    Clinical Course User Index [LD] Cleaster Tinnie LABOR, PA-C     FINAL CLINICAL IMPRESSION(S) / ED DIAGNOSES   Final diagnoses:  None     Rx / DC Orders   ED Discharge Orders     None        Note:  This document was prepared using Dragon voice recognition software and may include unintentional dictation errors.   Cleaster Tinnie LABOR, PA-C 06/02/24 1541    Willo Dunnings, MD 06/02/24 [phone]

## 2024-06-02 NOTE — ED Provider Notes (Addendum)
-----------------------------------------   3:41 PM on 06/02/2024 -----------------------------------------  Blood pressure (!) 102/56, pulse 68, temperature 98.7 F (37.1 C), temperature source Oral, resp. rate 17, height 5' 4 (1.626 m), weight 68 kg, SpO2 99%, unknown if currently breastfeeding.  Assuming care from [facility].  In short, Taylor Miller is a 28 y.o. female with a chief complaint of Emesis During Pregnancy .  Refer to the original H&P for additional details.  The current plan of care is to follow-up US  results and reassess following phenergan  for nausea.  ----------------------------------------- 5:07 PM on 06/02/2024 ----------------------------------------- Right upper quadrant ultrasound is unremarkable, no evidence of biliary obstruction, especially given she has an indirect bilirubinemia.  She was informed of this finding and need to follow-up as an outpatient for repeat labs.  Obstetric ultrasound is also reassuring with intrauterine gestational and yolk sac.  Nausea also improved on reassessment and patient tolerating oral intake without difficulty, is appropriate for discharge home with OB/GYN follow-up.  She was counseled to return to the ED for new or worsening symptoms, patient agrees with plan.  ----------------------------------------- 6:44 PM on 06/02/2024 ----------------------------------------- Patient had some ongoing nausea following Phenergan , vomited a couple of times but then felt better following IV Reglan .  She is currently tolerating water without difficulty, was offered admission to the hospital but declines and prefers to follow-up as an outpatient.    Willo Dunnings, MD 06/02/24 OLIVA    Willo Dunnings, MD 06/02/24 [phone]

## 2024-06-02 NOTE — ED Triage Notes (Signed)
 Pt to ED for N/V since yesterday in early pregnancy. Is about [redacted] weeks pregnant. Denies vaginal bleeding. Endorses pain to muscles in abdomen from throwing up. Currently dry heaving.

## 2024-06-05 ENCOUNTER — Other Ambulatory Visit: Payer: Self-pay

## 2024-06-05 ENCOUNTER — Emergency Department
Admission: EM | Admit: 2024-06-05 | Discharge: 2024-06-05 | Disposition: A | Discharge status: Home / Self Care | Attending: Emergency Medicine | Admitting: Emergency Medicine

## 2024-06-05 ENCOUNTER — Encounter: Payer: Self-pay | Admitting: Emergency Medicine

## 2024-06-05 DIAGNOSIS — Z3A01 Less than 8 weeks gestation of pregnancy: Secondary | ICD-10-CM | POA: Diagnosis not present

## 2024-06-05 DIAGNOSIS — O210 Mild hyperemesis gravidarum: Secondary | ICD-10-CM | POA: Diagnosis not present

## 2024-06-05 DIAGNOSIS — O219 Vomiting of pregnancy, unspecified: Secondary | ICD-10-CM | POA: Diagnosis present

## 2024-06-05 LAB — CBC
HCT: 37.1 % (ref 36.0–46.0)
Hemoglobin: 12.9 g/dL (ref 12.0–15.0)
MCH: 28.5 pg (ref 26.0–34.0)
MCHC: 34.8 g/dL (ref 30.0–36.0)
MCV: 82.1 fL (ref 80.0–100.0)
Platelets: 391 K/uL (ref 150–400)
RBC: 4.52 MIL/uL (ref 3.87–5.11)
RDW: 12.4 % (ref 11.5–15.5)
WBC: 17.4 K/uL — ABNORMAL HIGH (ref 4.0–10.5)
nRBC: 0 % (ref 0.0–0.2)

## 2024-06-05 LAB — COMPREHENSIVE METABOLIC PANEL WITH GFR
ALT: 22 U/L (ref 0–44)
AST: 19 U/L (ref 15–41)
Albumin: 4.5 g/dL (ref 3.5–5.0)
Alkaline Phosphatase: 50 U/L (ref 38–126)
Anion gap: 11 (ref 5–15)
BUN: 15 mg/dL (ref 6–20)
CO2: 24 mmol/L (ref 22–32)
Calcium: 9.4 mg/dL (ref 8.9–10.3)
Chloride: 98 mmol/L (ref 98–111)
Creatinine, Ser: 0.58 mg/dL (ref 0.44–1.00)
GFR, Estimated: >60 mL/min (ref >60)
Glucose, Bld: 116 mg/dL — ABNORMAL HIGH (ref 70–99)
Potassium: 3.1 mmol/L — ABNORMAL LOW (ref 3.5–5.1)
Sodium: 133 mmol/L — ABNORMAL LOW (ref 135–145)
Total Bilirubin: 4.5 mg/dL — ABNORMAL HIGH (ref 0.0–1.2)
Total Protein: 8 g/dL (ref 6.5–8.1)

## 2024-06-05 LAB — URINALYSIS, ROUTINE W REFLEX MICROSCOPIC
Bilirubin Urine: NEGATIVE
Glucose, UA: >=500 mg/dL — AB
Hgb urine dipstick: NEGATIVE
Ketones, ur: 20 mg/dL — AB
Nitrite: NEGATIVE
Protein, ur: NEGATIVE mg/dL
Specific Gravity, Urine: 1.009 (ref 1.005–1.030)
pH: 6 (ref 5.0–8.0)

## 2024-06-05 LAB — POC URINE PREG, ED: Preg Test, Ur: POSITIVE — AB

## 2024-06-05 LAB — LIPASE, BLOOD: Lipase: 25 U/L (ref 11–51)

## 2024-06-05 MED ORDER — METOCLOPRAMIDE HCL 10 MG PO TABS: 10.0000 mg | ORAL_TABLET | Freq: Three times a day (TID) | ORAL | 0 refills | Status: AC

## 2024-06-05 MED ORDER — METOCLOPRAMIDE HCL 5 MG/ML IJ SOLN
10.0000 mg | Freq: Once | INTRAMUSCULAR | Status: AC
  Administered 2024-06-05: 10 mg via INTRAVENOUS
  Filled 2024-06-05: qty 2

## 2024-06-05 MED ORDER — DEXTROSE 5 % AND 0.9 % NACL IV BOLUS
1000.0000 mL | Freq: Once | INTRAVENOUS | Status: AC
  Administered 2024-06-05: 1000 mL via INTRAVENOUS
  Filled 2024-06-05: qty 1000

## 2024-06-05 MED ORDER — SODIUM CHLORIDE 0.9 % IV BOLUS
1000.0000 mL | Freq: Once | INTRAVENOUS | Status: AC
  Administered 2024-06-05: 1000 mL via INTRAVENOUS

## 2024-06-05 NOTE — ED Provider Notes (Signed)
 [facility] Provider Note    Event Date/Time   First MD Initiated Contact with Patient 06/05/24 1125     (approximate)   History   Emesis   HPI  LASHICA HANNAY is a 28 y.o. female G3, P2 currently pregnant around 6 weeks who comes in with concerns for vomiting.  I reviewed where patient was seen here in the emergency room a couple of days ago had an ultrasound that was reassuring without any gallstones had a transvaginal ultrasound that showed early intrauterine gestational sac.  Patient reports that she is try the over-the-counter medications that were prescribed..  Reviewed that patient was prescribed Phenergan .  She reports that the [facility] team also prescribed her something which appear to be doxylamine , Diclegis , Zofran , famotidine, Zofran .  She reports that none of these are working.  She does report that she has had prior history of this in pregnancy.  Denies being a diabetic any vaginal discharge or other concerns.   Physical Exam   Triage Vital Signs: ED Triage Vitals  Encounter Vitals Group     BP 06/05/24 1025 115/62     Girls Systolic BP Percentile --      Girls Diastolic BP Percentile --      Boys Systolic BP Percentile --      Boys Diastolic BP Percentile --      Pulse Rate 06/05/24 1025 76     Resp 06/05/24 1025 18     Temp 06/05/24 1025 (!) 97.4 F (36.3 C)     Temp Source 06/05/24 1025 Oral     SpO2 06/05/24 1025 100 %     Weight 06/05/24 1023 145 lb (65.8 kg)     Height 06/05/24 1023 5' 2 (1.575 m)     Head Circumference --      Peak Flow --      Pain Score 06/05/24 1023 6     Pain Loc --      Pain Education --      Exclude from Growth Chart --     Most recent vital signs: Vitals:   06/05/24 1025  BP: 115/62  Pulse: 76  Resp: 18  Temp: (!) 97.4 F (36.3 C)  SpO2: 100%     General: Awake, no distress.  CV:  Good peripheral perfusion.  Resp:  Normal effort.  Abd:  No distention.  Soft and nontender Other:     ED  Results / Procedures / Treatments   Labs (all labs ordered are listed, but only abnormal results are displayed) Labs Reviewed  COMPREHENSIVE METABOLIC PANEL WITH GFR - Abnormal; Notable for the following components:      Result Value   Sodium 133 (*)    Potassium 3.1 (*)    Glucose, Bld 116 (*)    Total Bilirubin 4.5 (*)    All other components within normal limits  CBC - Abnormal; Notable for the following components:   WBC 17.4 (*)    All other components within normal limits  LIPASE, BLOOD  URINALYSIS, ROUTINE W REFLEX MICROSCOPIC  POC URINE PREG, ED     PROCEDURES:  Critical Care performed: No  Procedures   MEDICATIONS ORDERED IN ED: Medications  dextrose  5 % and 0.9% NaCl 5-0.9 % bolus 1,000 mL (1,000 mLs Intravenous New Bag/Given 06/05/24 1227)  metoCLOPramide  (REGLAN ) injection 10 mg (10 mg Intravenous Given 06/05/24 1147)  sodium chloride  0.9 % bolus 1,000 mL (1,000 mLs Intravenous New Bag/Given 06/05/24 1147)  IMPRESSION / MDM / ASSESSMENT AND PLAN / ED COURSE  I reviewed the triage vital signs and the nursing notes.   Patient's presentation is most consistent with acute presentation with potential threat to life or bodily function.   Differential includes hyperemesis of pregnancy, low suspicion for ectopic given recent ultrasound was concern for gallstones given recent ultrasound.  Pregnancy test is positive urine with ketones in it that are improving from a couple of days ago no evidence of UTI significant squamous cells noted in it.  Lipase normal CMP reassuring does have elevated bilirubin which is suspect is from dehydration.  She is COVID had some chronic elevation of her bilirubin for the past 2 years.  She already had ultrasound done a few days ago without any gallstones or CBD dilation.  Her LFTs otherwise remain normal.  She can follow this up with her primary care doctor.  1:29 PM patient reports feeling much improved.  She states that she is ready to  be discharged home.  We discussed her abnormal bilirubin test but her abdomen is soft and nontender I have low suspicion for choledocholithiasis at this time as she really never had any abdominal pain.  She understands return precautions in regards to this.  She wants to try Reglan  at home given this work today and will follow-up outpatient with her OB/GYN.  She understands not to take this with all of her other antiemetics as it can cause issues of taking too much medications and she expressed understanding and felt comfortable with discharge.  Patient's had EKGs with prior normal QTc's.  Repeat EKG here was sinus without any ST elevation or T wave inversions and QTc was normal  She denies any vaginal bleeding, concerns for STDs  FINAL CLINICAL IMPRESSION(S) / ED DIAGNOSES   Final diagnoses:  Hyperemesis gravidarum     Rx / DC Orders   ED Discharge Orders          Ordered    metoCLOPramide  (REGLAN ) 10 MG tablet  3 times daily with meals        06/05/24 1331             Note:  This document was prepared using Dragon voice recognition software and may include unintentional dictation errors.   Ernest Ronal BRAVO, MD 06/05/24 [phone]

## 2024-06-05 NOTE — Discharge Instructions (Addendum)
 Your bilirubin level is elevated you should follow this up with your primary care doctor to discuss all this does look like this has been a chronic issue for you.  However if develop pain in your right upper abdomen you should return to the ER for repeat evaluation.  You can use the Reglan  to help with your nausea . Call your OB to make a follow-up and return to the ER for worsening symptoms or any other concerns

## 2024-06-05 NOTE — ED Triage Notes (Addendum)
 Pt via POV from home Pt c/o vomiting since Sunday, pt was seen and d/c on Sunday for same. Reports she is [redacted] weeks pregnant. Denies any abd pain or vaginal bleeding. Pt is A&OX4 and NAD
